# Patient Record
Sex: Female | Born: 1950 | Race: White | Hispanic: No | Smoking: Never smoker
Health system: Southern US, Community
[De-identification: ages and names within clinical notes are randomized; demographics above are authoritative.]

## PROBLEM LIST (undated history)

## (undated) DIAGNOSIS — E785 Hyperlipidemia, unspecified: Secondary | ICD-10-CM

## (undated) DIAGNOSIS — J45909 Unspecified asthma, uncomplicated: Secondary | ICD-10-CM

## (undated) DIAGNOSIS — J302 Other seasonal allergic rhinitis: Secondary | ICD-10-CM

## (undated) DIAGNOSIS — I10 Essential (primary) hypertension: Secondary | ICD-10-CM

## (undated) DIAGNOSIS — N289 Disorder of kidney and ureter, unspecified: Secondary | ICD-10-CM

## (undated) HISTORY — PX: TONSILLECTOMY: SUR1361

## (undated) HISTORY — DX: Hyperlipidemia, unspecified: E78.5

## (undated) HISTORY — DX: Essential (primary) hypertension: I10

## (undated) HISTORY — PX: ADENOIDECTOMY: SUR15

## (undated) HISTORY — DX: Unspecified asthma, uncomplicated: J45.909

## (undated) HISTORY — DX: Disorder of kidney and ureter, unspecified: N28.9

## (undated) HISTORY — DX: Other seasonal allergic rhinitis: J30.2

---

## 1990-09-08 HISTORY — PX: VESICOVAGINAL FISTULA CLOSURE W/ TAH: SUR271

## 1990-09-08 HISTORY — PX: APPENDECTOMY: SHX54

## 1997-12-20 ENCOUNTER — Other Ambulatory Visit: Admission: RE | Admit: 1997-12-20 | Discharge: 1997-12-20 | Payer: Self-pay | Admitting: Obstetrics and Gynecology

## 1998-12-21 ENCOUNTER — Other Ambulatory Visit: Admission: RE | Admit: 1998-12-21 | Discharge: 1998-12-21 | Payer: Self-pay | Admitting: Obstetrics and Gynecology

## 2001-01-13 ENCOUNTER — Other Ambulatory Visit: Admission: RE | Admit: 2001-01-13 | Discharge: 2001-01-13 | Payer: Self-pay | Admitting: Obstetrics and Gynecology

## 2002-01-27 ENCOUNTER — Other Ambulatory Visit: Admission: RE | Admit: 2002-01-27 | Discharge: 2002-01-27 | Payer: Self-pay | Admitting: Obstetrics and Gynecology

## 2003-03-01 ENCOUNTER — Other Ambulatory Visit: Admission: RE | Admit: 2003-03-01 | Discharge: 2003-03-01 | Payer: Self-pay | Admitting: Obstetrics and Gynecology

## 2005-04-07 ENCOUNTER — Other Ambulatory Visit: Admission: RE | Admit: 2005-04-07 | Discharge: 2005-04-07 | Payer: Self-pay | Admitting: Obstetrics and Gynecology

## 2006-04-23 ENCOUNTER — Encounter: Admission: RE | Admit: 2006-04-23 | Discharge: 2006-04-23 | Payer: Self-pay | Admitting: Obstetrics and Gynecology

## 2007-05-05 ENCOUNTER — Encounter: Admission: RE | Admit: 2007-05-05 | Discharge: 2007-05-05 | Payer: Self-pay | Admitting: Obstetrics and Gynecology

## 2008-05-09 ENCOUNTER — Encounter: Admission: RE | Admit: 2008-05-09 | Discharge: 2008-05-09 | Payer: Self-pay | Admitting: Obstetrics and Gynecology

## 2009-05-16 ENCOUNTER — Encounter: Admission: RE | Admit: 2009-05-16 | Discharge: 2009-05-16 | Payer: Self-pay | Admitting: Obstetrics and Gynecology

## 2010-05-21 ENCOUNTER — Encounter: Admission: RE | Admit: 2010-05-21 | Discharge: 2010-05-21 | Payer: Self-pay | Admitting: Obstetrics and Gynecology

## 2010-08-27 ENCOUNTER — Encounter
Admission: RE | Admit: 2010-08-27 | Discharge: 2010-08-27 | Payer: Self-pay | Source: Home / Self Care | Attending: Internal Medicine | Admitting: Internal Medicine

## 2010-09-29 ENCOUNTER — Encounter: Payer: Self-pay | Admitting: Obstetrics and Gynecology

## 2011-02-19 ENCOUNTER — Other Ambulatory Visit: Payer: Self-pay | Admitting: Obstetrics and Gynecology

## 2011-02-19 DIAGNOSIS — Z1231 Encounter for screening mammogram for malignant neoplasm of breast: Secondary | ICD-10-CM

## 2011-05-27 ENCOUNTER — Ambulatory Visit
Admission: RE | Admit: 2011-05-27 | Discharge: 2011-05-27 | Disposition: A | Discharge status: Home / Self Care | Payer: Commercial Managed Care - PPO | Source: Ambulatory Visit | Attending: Obstetrics and Gynecology | Admitting: Obstetrics and Gynecology

## 2011-05-27 DIAGNOSIS — Z1231 Encounter for screening mammogram for malignant neoplasm of breast: Secondary | ICD-10-CM

## 2011-11-04 ENCOUNTER — Other Ambulatory Visit (HOSPITAL_COMMUNITY): Payer: Self-pay | Admitting: Internal Medicine

## 2011-11-04 DIAGNOSIS — E039 Hypothyroidism, unspecified: Secondary | ICD-10-CM

## 2011-11-07 ENCOUNTER — Ambulatory Visit (HOSPITAL_COMMUNITY)
Admission: RE | Admit: 2011-11-07 | Discharge: 2011-11-07 | Disposition: A | Discharge status: Home / Self Care | Payer: 59 | Source: Ambulatory Visit | Attending: Internal Medicine | Admitting: Internal Medicine

## 2011-11-07 DIAGNOSIS — E039 Hypothyroidism, unspecified: Secondary | ICD-10-CM | POA: Insufficient documentation

## 2011-11-07 DIAGNOSIS — R599 Enlarged lymph nodes, unspecified: Secondary | ICD-10-CM | POA: Insufficient documentation

## 2011-12-16 ENCOUNTER — Ambulatory Visit (INDEPENDENT_AMBULATORY_CARE_PROVIDER_SITE_OTHER): Payer: 59 | Admitting: Internal Medicine

## 2011-12-16 ENCOUNTER — Encounter: Payer: Self-pay | Admitting: Internal Medicine

## 2011-12-16 VITALS — BP 132/90 | HR 84 | Temp 98.5°F | Ht 64.0 in | Wt 154.4 lb

## 2011-12-16 DIAGNOSIS — R05 Cough: Secondary | ICD-10-CM | POA: Insufficient documentation

## 2011-12-16 MED ORDER — BECLOMETHASONE DIPROPIONATE 40 MCG/ACT IN AERS: 2.0000 | INHALATION_SPRAY | Freq: Two times a day (BID) | RESPIRATORY_TRACT | Status: DC

## 2011-12-16 NOTE — Progress Notes (Signed)
  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   12/16/2011 1st pulmonary cc new onset refractory daily cough since abrupt onsetDec 2012  not related to seasonal rhinitis just started the zantac 300 mg late march, abx changed mucus from brown to white sev tbsp max per day, last prednisone rx April 1st.  Cough some better nowmostly day some at hs, not worse talking, occ bladder incontinent on hrt due to hot fashes but no overt HB.  Not sure inhalers are helping.   asthmanex made it worse, no better on dulera  Presently Sleeping ok without nocturnal  or early am exacerbation  of respiratory  c/o's or need for noct saba. Also denies any obvious fluctuation of symptoms with weather or environmental changes or other aggravating or alleviating factors except as outlined above   Review of Systems  Constitutional: Negative for fever, chills and unexpected weight change.  HENT: Positive for congestion and sneezing. Negative for ear pain, nosebleeds, sore throat, rhinorrhea, trouble swallowing, dental problem, voice change, postnasal drip and sinus pressure.   Eyes: Negative for visual disturbance.  Respiratory: Positive for cough. Negative for choking and shortness of breath.   Cardiovascular: Negative for chest pain and leg swelling.  Gastrointestinal: Negative for vomiting, abdominal pain and diarrhea.  Genitourinary: Negative for difficulty urinating.  Musculoskeletal: Negative for arthralgias.  Skin: Negative for rash.  Neurological: Negative for tremors, syncope and headaches.  Hematological: Does not bruise/bleed easily.       Objective:   Physical Exam  Amb animated wf nad 154 12/16/2011  HEENT: nl dentition, turbinates, and orophanx. Nl external ear canals  without cough reflex   NECK :  without JVD/Nodes/TM/ nl carotid upstrokes bilaterally   LUNGS: no acc muscle use, clear to A and P bilaterally without cough on insp or exp maneuvers   CV:  RRR  no s3 or murmur or increase in P2, no edema   ABD:  soft and nontender with nl excursion in the supine position. No bruits or organomegaly, bowel sounds nl  MS:  warm without deformities, calf tenderness, cyanosis or clubbing  SKIN: warm and dry without lesions    NEURO:  alert, approp, no deficits   cxr 11/19/11 HH, no acute abn        Assessment & Plan:

## 2011-12-16 NOTE — Patient Instructions (Addendum)
qvar 40 Take 2 puffs first thing in am and then another 2 puffs about 12 hours later.   Stop dulera    Work on inhaler technique:  relax and gently blow all the way out then take a nice smooth deep breath back in, triggering the inhaler at same time you start breathing in.  Hold for up to 5 seconds if you can.  Rinse and gargle with water when done   If your mouth or throat starts to bother you,   I suggest you time the inhaler to your dental care and after using the inhaler(s) brush teeth and tongue with a baking soda containing toothpaste and when you rinse this out, gargle with it first to see if this helps your mouth and throat.     Please see patient coordinator before you leave today  to schedule sinus CT   Reducing hormone pill to every other day  Delsym 2 tsp every 12 hour as needed  GERD (REFLUX)  is an extremely common cause of respiratory symptoms, many times with no significant heartburn at all.    It can be treated with medication, but also with lifestyle changes including avoidance of late meals, excessive alcohol, smoking cessation, and avoid fatty foods, chocolate, peppermint, colas, red wine, and acidic juices such as orange juice.  NO MINT OR MENTHOL PRODUCTS SO NO COUGH DROPS  USE SUGARLESS CANDY INSTEAD (jolley ranchers or Stover's)  NO OIL BASED VITAMINS - use powdered substitutes.   Please schedule a follow up office visit in 3 weeks, sooner if needed

## 2011-12-17 NOTE — Assessment & Plan Note (Signed)
The most common causes of chronic cough in immunocompetent adults include the following: upper airway cough syndrome (UACS), previously referred to as postnasal drip syndrome (PNDS), which is caused by variety of rhinosinus conditions; (2) asthma; (3) GERD; (4) chronic bronchitis from cigarette smoking or other inhaled environmental irritants; (5) nonasthmatic eosinophilic bronchitis; and (6) bronchiectasis.   These conditions, singly or in combination, have accounted for up to 94% of the causes of chronic cough in prospective studies.   Other conditions have constituted no >6% of the causes in prospective studies These have included bronchogenic carcinoma, chronic interstitial pneumonia, sarcoidosis, left ventricular failure, ACEI-induced cough, and aspiration from a condition associated with pharyngeal dysfunction.    Chronic cough is often simultaneously caused by more than one condition. A single cause has been found from 38 to 82% of the time, multiple causes from 18 to 62%. Multiply caused cough has been the result of three diseases up to 42% of the time.   Of the three most common causes of chronic cough, only one (GERD)  can actually cause the other two (asthma and post nasal drip syndrome)  and perpetuate the cylce of cough inducing airway trauma, inflammation, heightened sensitivity to reflux which is prompted by the cough itself via a cyclical mechanism.    This may partially respond to steroids and look like asthma and post nasal drainage but never erradicated completely unless the cough and the secondary reflux are eliminated, preferably both at the same time.  While not intuitively obvious, many patients with chronic low grade reflux do not cough until there is a secondary insult that disturbs the protective epithelial barrier and exposes sensitive nerve endings.  This can be viral or direct physical injury such as with an endotracheal tube.   The point is that once this occurs, it is  difficult to eliminate using anything but a maximally effective acid suppression regimen at least in the short run, accompanied by an appropriate diet to address non acid GERD.   For now max rx for gerd and cyclical cough and try change dulera to qvar since less likely to make her cough due to upper airway irritation (other option is stop ICS altogether)

## 2011-12-19 ENCOUNTER — Encounter: Payer: Self-pay | Admitting: Internal Medicine

## 2011-12-19 ENCOUNTER — Ambulatory Visit (INDEPENDENT_AMBULATORY_CARE_PROVIDER_SITE_OTHER)
Admission: RE | Admit: 2011-12-19 | Discharge: 2011-12-19 | Disposition: A | Discharge status: Home / Self Care | Payer: 59 | Source: Ambulatory Visit | Attending: Internal Medicine | Admitting: Internal Medicine

## 2011-12-19 DIAGNOSIS — R05 Cough: Secondary | ICD-10-CM

## 2011-12-19 NOTE — Progress Notes (Signed)
Quick Note:  Spoke with pt and notified of results per Dr. Wert. Pt verbalized understanding and denied any questions.  ______ 

## 2012-01-06 ENCOUNTER — Encounter: Payer: Self-pay | Admitting: Internal Medicine

## 2012-01-06 ENCOUNTER — Ambulatory Visit (INDEPENDENT_AMBULATORY_CARE_PROVIDER_SITE_OTHER): Payer: 59 | Admitting: Internal Medicine

## 2012-01-06 VITALS — BP 124/90 | HR 85 | Temp 97.7°F | Ht 64.0 in | Wt 164.4 lb

## 2012-01-06 DIAGNOSIS — R05 Cough: Secondary | ICD-10-CM

## 2012-01-06 NOTE — Assessment & Plan Note (Addendum)
Marked improvement on qvar and off dulera while on max gerd rx and tapering off HRT which can cause reflux. Still not clear what the precise cause of the cough is noting that chronic cough is often simultaneously caused by more than one condition. A single cause has been found from 38 to 82% of the time, multiple causes from 18 to 62%. Multiply caused cough has been the result of three diseases up to 42% of the time.   Most likely this gerd with a component of asthma with upper airway irritability that is minimized with the use of qvar as the choice for ICS  She is doing so well I'm inclined to rec she stay on same rx for now with option to taper off the qvar in a few months if doing great.

## 2012-01-06 NOTE — Progress Notes (Signed)
  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   12/16/2011 1st pulmonary cc new onset refractory daily cough since abrupt onsetDec 2012  not related to seasonal rhinitis just started the zantac 300 mg late march, abx changed mucus from brown to white sev tbsp max per day, last prednisone rx April 1st.  Cough some better now mostly day some at hs, not worse talking, occ bladder incontinent on hrt due to hot fashes but no overt HB.  Not sure inhalers are helping.   asthmanex made it worse, no better on dulera rec  qvar 40 Take 2 puffs first thing in am and then another 2 puffs about 12 hours later.   Stop dulera  Work on inhaler technique:  .    Schedule sinus CT > min sphenoidal thickening Reducing hormone pill to every other day Delsym 2 tsp every 12 hour as needed GERD diet   01/06/2012 f/u ov/Gradyn Shein cc cough much better, only using ventolin twice weekly, less than while on dulera, no sob. No purulent sputum or reflux symptoms.  Presently Sleeping ok without nocturnal  or early am exacerbation  of respiratory  c/o's or need for noct saba. Also denies any obvious fluctuation of symptoms with weather or environmental changes or other aggravating or alleviating factors except as outlined above.  ROS  At present neg for  any significant sore throat, dysphagia, dental problems, itching, sneezing,  nasal congestion or excess/ purulent secretions, ear ache,   fever, chills, sweats, unintended wt loss, pleuritic or exertional cp, hemoptysis, palpitations, orthopnea pnd or leg swelling.  Also denies presyncope, palpitations, heartburn, abdominal pain, anorexia, nausea, vomiting, diarrhea  or change in bowel or urinary habits, change in stools or urine, dysuria,hematuria,   rash, arthralgias, visual complaints, headache, numbness weakness or ataxia or problems with walking or coordination. No noted change in mood/affect or memory.                          Objective:   Physical Exam  Amb animated wf nad  154 12/16/2011  > 01/06/2012  164  HEENT: nl dentition, turbinates, and orophanx. Nl external ear canals without cough reflex   NECK :  without JVD/Nodes/TM/ nl carotid upstrokes bilaterally   LUNGS: no acc muscle use, clear to A and P bilaterally without cough on insp or exp maneuvers   CV:  RRR  no s3 or murmur or increase in P2, no edema   ABD:  soft and nontender with nl excursion in the supine position. No bruits or organomegaly, bowel sounds nl  MS:  warm without deformities, calf tenderness, cyanosis or clubbing     cxr 11/19/11 HH, no acute abn        Assessment & Plan:

## 2012-01-06 NOTE — Patient Instructions (Signed)
No change in recommendations   If you are satisfied with your treatment plan let your doctor know and he/she can either refill your medications or you can return here when your prescription runs out.     If in any way you are not 100% satisfied,  please tell us.  If 100% better, tell your friends!

## 2012-01-20 ENCOUNTER — Other Ambulatory Visit: Payer: Self-pay | Admitting: Obstetrics and Gynecology

## 2012-01-20 DIAGNOSIS — Z1231 Encounter for screening mammogram for malignant neoplasm of breast: Secondary | ICD-10-CM

## 2012-05-28 ENCOUNTER — Ambulatory Visit
Admission: RE | Admit: 2012-05-28 | Discharge: 2012-05-28 | Disposition: A | Discharge status: Home / Self Care | Payer: 59 | Source: Ambulatory Visit | Attending: Obstetrics and Gynecology | Admitting: Obstetrics and Gynecology

## 2012-05-28 DIAGNOSIS — Z1231 Encounter for screening mammogram for malignant neoplasm of breast: Secondary | ICD-10-CM

## 2013-03-04 ENCOUNTER — Other Ambulatory Visit: Payer: Self-pay

## 2013-03-04 DIAGNOSIS — Z1231 Encounter for screening mammogram for malignant neoplasm of breast: Secondary | ICD-10-CM

## 2013-05-31 ENCOUNTER — Ambulatory Visit
Admission: RE | Admit: 2013-05-31 | Discharge: 2013-05-31 | Disposition: A | Discharge status: Home / Self Care | Payer: 59 | Source: Ambulatory Visit

## 2013-05-31 DIAGNOSIS — Z1231 Encounter for screening mammogram for malignant neoplasm of breast: Secondary | ICD-10-CM

## 2014-02-27 ENCOUNTER — Other Ambulatory Visit: Payer: Self-pay | Admitting: *Deleted

## 2014-02-27 ENCOUNTER — Other Ambulatory Visit: Payer: Self-pay | Admitting: Internal Medicine

## 2014-02-27 DIAGNOSIS — M810 Age-related osteoporosis without current pathological fracture: Secondary | ICD-10-CM

## 2014-02-28 ENCOUNTER — Other Ambulatory Visit: Payer: Self-pay

## 2014-02-28 DIAGNOSIS — Z1231 Encounter for screening mammogram for malignant neoplasm of breast: Secondary | ICD-10-CM

## 2014-05-31 ENCOUNTER — Other Ambulatory Visit (HOSPITAL_COMMUNITY): Payer: Self-pay | Admitting: Internal Medicine

## 2014-05-31 DIAGNOSIS — R61 Generalized hyperhidrosis: Secondary | ICD-10-CM

## 2014-06-06 ENCOUNTER — Ambulatory Visit
Admission: RE | Admit: 2014-06-06 | Discharge: 2014-06-06 | Disposition: A | Discharge status: Home / Self Care | Payer: 59 | Source: Ambulatory Visit | Attending: Internal Medicine | Admitting: Internal Medicine

## 2014-06-06 ENCOUNTER — Ambulatory Visit
Admission: RE | Admit: 2014-06-06 | Discharge: 2014-06-06 | Disposition: A | Discharge status: Home / Self Care | Payer: 59 | Source: Ambulatory Visit

## 2014-06-06 ENCOUNTER — Ambulatory Visit (HOSPITAL_COMMUNITY)
Admission: RE | Admit: 2014-06-06 | Discharge: 2014-06-06 | Disposition: A | Discharge status: Home / Self Care | Payer: 59 | Source: Ambulatory Visit | Attending: Internal Medicine | Admitting: Internal Medicine

## 2014-06-06 DIAGNOSIS — K838 Other specified diseases of biliary tract: Secondary | ICD-10-CM | POA: Insufficient documentation

## 2014-06-06 DIAGNOSIS — K7689 Other specified diseases of liver: Secondary | ICD-10-CM | POA: Insufficient documentation

## 2014-06-06 DIAGNOSIS — N289 Disorder of kidney and ureter, unspecified: Secondary | ICD-10-CM | POA: Diagnosis not present

## 2014-06-06 DIAGNOSIS — R944 Abnormal results of kidney function studies: Secondary | ICD-10-CM | POA: Insufficient documentation

## 2014-06-06 DIAGNOSIS — R61 Generalized hyperhidrosis: Secondary | ICD-10-CM | POA: Diagnosis present

## 2014-06-06 DIAGNOSIS — M810 Age-related osteoporosis without current pathological fracture: Secondary | ICD-10-CM

## 2014-06-06 DIAGNOSIS — Z1231 Encounter for screening mammogram for malignant neoplasm of breast: Secondary | ICD-10-CM

## 2014-10-12 ENCOUNTER — Other Ambulatory Visit (HOSPITAL_COMMUNITY): Payer: Self-pay | Admitting: Internal Medicine

## 2014-10-12 DIAGNOSIS — R2242 Localized swelling, mass and lump, left lower limb: Secondary | ICD-10-CM

## 2014-10-31 ENCOUNTER — Ambulatory Visit (HOSPITAL_COMMUNITY): Payer: 59

## 2014-11-14 ENCOUNTER — Ambulatory Visit (HOSPITAL_COMMUNITY)
Admission: RE | Admit: 2014-11-14 | Discharge: 2014-11-14 | Disposition: A | Discharge status: Home / Self Care | Payer: 59 | Source: Ambulatory Visit | Attending: Internal Medicine | Admitting: Internal Medicine

## 2014-11-14 DIAGNOSIS — M7122 Synovial cyst of popliteal space [Baker], left knee: Secondary | ICD-10-CM | POA: Insufficient documentation

## 2014-11-14 DIAGNOSIS — R2242 Localized swelling, mass and lump, left lower limb: Secondary | ICD-10-CM

## 2014-11-14 DIAGNOSIS — M67462 Ganglion, left knee: Secondary | ICD-10-CM | POA: Insufficient documentation

## 2015-05-28 DIAGNOSIS — J309 Allergic rhinitis, unspecified: Secondary | ICD-10-CM | POA: Insufficient documentation

## 2015-05-28 DIAGNOSIS — K219 Gastro-esophageal reflux disease without esophagitis: Secondary | ICD-10-CM

## 2015-05-28 DIAGNOSIS — J45909 Unspecified asthma, uncomplicated: Secondary | ICD-10-CM | POA: Insufficient documentation

## 2015-08-16 DIAGNOSIS — J301 Allergic rhinitis due to pollen: Secondary | ICD-10-CM

## 2015-08-17 DIAGNOSIS — J3081 Allergic rhinitis due to animal (cat) (dog) hair and dander: Secondary | ICD-10-CM | POA: Diagnosis not present

## 2015-10-05 ENCOUNTER — Encounter: Payer: Self-pay | Admitting: Allergy and Immunology

## 2015-10-05 ENCOUNTER — Ambulatory Visit (INDEPENDENT_AMBULATORY_CARE_PROVIDER_SITE_OTHER): Payer: Medicare Other | Admitting: Allergy and Immunology

## 2015-10-05 VITALS — BP 122/90 | HR 88 | Resp 16

## 2015-10-05 DIAGNOSIS — J387 Other diseases of larynx: Secondary | ICD-10-CM

## 2015-10-05 DIAGNOSIS — J309 Allergic rhinitis, unspecified: Secondary | ICD-10-CM

## 2015-10-05 DIAGNOSIS — H1010 Acute atopic conjunctivitis, unspecified eye: Secondary | ICD-10-CM

## 2015-10-05 DIAGNOSIS — J4540 Moderate persistent asthma, uncomplicated: Secondary | ICD-10-CM

## 2015-10-05 DIAGNOSIS — K219 Gastro-esophageal reflux disease without esophagitis: Secondary | ICD-10-CM

## 2015-10-05 MED ORDER — RANITIDINE HCL 300 MG PO CAPS: 300.0000 mg | ORAL_CAPSULE | Freq: Every day | ORAL | Status: DC

## 2015-10-05 MED ORDER — OMEPRAZOLE 40 MG PO CPDR: 40.0000 mg | DELAYED_RELEASE_CAPSULE | Freq: Every day | ORAL | Status: DC

## 2015-10-05 NOTE — Patient Instructions (Addendum)
  1. Continue Qvar 40 2 inhalations 1-2 times per day depending on disease activity  2. Continue omeprazole 40 mg in the morning and ranitidine 300 mg in the evening  3. Continue Qnasl 80 1-2 sprays each nostril one time per day   4. Continue antihistamine and ProAir HFA as needed  5. Consider gallbladder functional scan  6. Continue immunotherapy and EpiPen  7. Return to clinic in 6 months or earlier if problem

## 2015-10-05 NOTE — Progress Notes (Signed)
[facility]  Follow-up Note  Referring Provider: Gordan Payment., MD Primary Provider: Feliciana Rossetti, MD Date of Office Visit: 10/05/2015  Subjective:   Lauren Benitez is a 65 y.o. female who [facility] on 10/05/2015 in re-evaluation of the following:  HPI Comments: Lauren Benitez returns to this clinic in reevaluation of her asthma and allergic rhinitis and LPR. She's done quite well over the course of the past year. She did have one flare of her asthma in September for her to get a systemic steroid. The trigger for this flareup. To be an infectious disease involving her upper airways for which she took an antibiotic. Otherwise, she has done really well and does not use any short acting bronchodilator while consistently using her Qvar 40 2 inhalations usually one time per day. She did receive the flu vaccine this year  Lauren Benitez is been doing quite well regarding her upper airway while consistently using her Qnasl. She occasionally takes an antihistamine as well.  Reflux is been going quite well for [facility] as long she continues to use a combination of omeprazole and ranitidine. She has been having this unusual quick onset sharp abdominal pain the last approximately one hour is occurred 5 times over the course of the past 18 months. Apparently she has had a sonogram which identified gallbladder sludge and possibly an abdominal CT scan which did not identify any significant abnormality. She does not get any associated nausea or diarrhea and there is no associated systemic or constitutional symptoms.   Current Outpatient Prescriptions on File Prior to Visit  Medication Sig Dispense Refill  . albuterol (PROAIR HFA) 108 (90 BASE) MCG/ACT inhaler Inhale 2 puffs into the lungs every 6 (six) hours as needed.    . Beclomethasone Dipropionate (QNASL) 80 MCG/ACT AERS Place 1 spray into the nose daily.    . Calcium  Carbonate-Vitamin D (CALTRATE 600+D PO) Take 1 tablet by mouth at bedtime.    Marland Kitchen dextromethorphan-guaiFENesin (MUCINEX DM) 30-600 MG per 12 hr tablet Take 1 tablet by mouth as needed.    . docusate sodium (COLACE) 100 MG capsule Take 100 mg by mouth daily.    Marland Kitchen levothyroxine (SYNTHROID, LEVOTHROID) 50 MCG tablet Take 50 mcg by mouth daily.    Marland Kitchen loratadine (CLARITIN) 10 MG tablet Take 10 mg by mouth daily.    . Multiple Vitamin (MULTIVITAMIN) capsule Take 1 capsule by mouth daily.    . polyethylene glycol (MIRALAX / GLYCOLAX) packet Take 17 g by mouth daily as needed.    . simvastatin (ZOCOR) 40 MG tablet Take 40 mg by mouth every evening.    . beclomethasone (QVAR) 40 MCG/ACT inhaler Inhale 2 puffs into the lungs 2 (two) times daily. 1 Inhaler 12  . citalopram (CELEXA) 20 MG tablet Take 20 mg by mouth daily. Reported on 10/05/2015    . esomeprazole (NEXIUM) 40 MG capsule Take 40 mg by mouth daily after breakfast. Reported on 10/05/2015    . estrogen-methylTESTOSTERone (ESTRATEST HS) 0.625-1.25 MG per tablet Take 1 tablet by mouth daily. Reported on 10/05/2015    . fluconazole (DIFLUCAN) 150 MG tablet Take 150 mg by mouth daily. Reported on 10/05/2015    . mometasone (NASONEX) 50 MCG/ACT nasal spray Place 2 sprays into the nose daily as needed. Reported on 10/05/2015     No current facility-administered medications on file prior to visit.    Meds ordered this encounter  Medications  .  omeprazole (PRILOSEC) 40 MG capsule    Sig: Take 1 capsule (40 mg total) by mouth daily.    Dispense:  90 capsule    Refill:  1    90-day pharmacy  . ranitidine (ZANTAC) 300 MG capsule    Sig: Take 1 capsule (300 mg total) by mouth at bedtime.    Dispense:  90 capsule    Refill:  1    90-day supply    Past Medical History  Diagnosis Date  . Hyperlipemia   . Seasonal allergies   . Asthma     Past Surgical History  Procedure Laterality Date  . Vesicovaginal fistula closure w/ tah  1992  . Appendectomy   1992  . Adenoidectomy    . Tonsillectomy      Allergies  Allergen Reactions  . Avelox [Moxifloxacin Hcl In Nacl] Itching    Review of systems negative except as noted in HPI / PMHx or noted below:  Review of Systems  Constitutional: Negative.   HENT: Negative.   Eyes: Negative.   Respiratory: Negative.   Cardiovascular: Negative.   Gastrointestinal: Negative.   Genitourinary: Negative.   Musculoskeletal: Negative.   Skin: Negative.   Neurological: Negative.   Endo/Heme/Allergies: Negative.   Psychiatric/Behavioral: Negative.      Objective:   Filed Vitals:   10/05/15 1059  BP: 122/90  Pulse: 88  Resp: 16          Physical Exam  Constitutional: She is well-developed, well-nourished, and in no distress.  HENT:  Head: Normocephalic.  Right Ear: Tympanic membrane, external ear and ear canal normal.  Left Ear: Tympanic membrane, external ear and ear canal normal.  Nose: Nose normal. No mucosal edema or rhinorrhea.  Mouth/Throat: Uvula is midline, oropharynx is clear and moist and mucous membranes are normal. No oropharyngeal exudate.  Eyes: Conjunctivae are normal.  Neck: Trachea normal. No tracheal tenderness present. No tracheal deviation present. No thyromegaly present.  Cardiovascular: Normal rate, regular rhythm, S1 normal, S2 normal and normal heart sounds.   No murmur heard. Pulmonary/Chest: Breath sounds normal. No stridor. No respiratory distress. She has no wheezes. She has no rales.  Musculoskeletal: She exhibits no edema.  Lymphadenopathy:       Head (right side): No tonsillar adenopathy present.       Head (left side): No tonsillar adenopathy present.    She has no cervical adenopathy.    She has no axillary adenopathy.  Neurological: She is alert. Gait normal.  Skin: No rash noted. She is not diaphoretic. No erythema. Nails show no clubbing.  Psychiatric: Mood and affect normal.    Diagnostics:    Spirometry was performed and demonstrated an  FEV1 of 2.58 at 108 % of predicted.  The patient had an Asthma Control Test with the following results: ACT Total Score: 23.    Assessment and Plan:   1. Moderate persistent asthma, uncomplicated   2. Allergic rhinoconjunctivitis   3. LPRD (laryngopharyngeal reflux disease)     1. Continue Qvar 40 2 inhalations 1-2 times per day depending on disease activity  2. Continue omeprazole 40 mg in the morning and ranitidine 300 mg in the evening  3. Continue Qnasl 80 1-2 sprays each nostril one time per day   4. Continue antihistamine and ProAir HFA as needed  5. Consider gallbladder functional scan  6. Continue immunotherapy and EpiPen  7. Return to clinic in 6 months or earlier if problem  Lauren Benitez is doing quite well at this point  in time and we will continue to have her use the therapy mentioned above and see her back in this clinic in 6 months or earlier if there is a problem. Certainly she will contact me should she have significant problems in the future. Concerning her sharp pain located in her abdomen that appears to occur on an intermittent basis and last approximately one hour, I suspect that this is some type of obstruction within her GI tract or possibly an ischemic event. She no longer lives in town and [place] asked her to follow-up with her physician regarding further evaluation of this issue which I would think initially would involve a gallbladder functional scan.   [name], MD [facility]

## 2016-01-24 DIAGNOSIS — J301 Allergic rhinitis due to pollen: Secondary | ICD-10-CM | POA: Diagnosis not present

## 2016-01-25 DIAGNOSIS — J3081 Allergic rhinitis due to animal (cat) (dog) hair and dander: Secondary | ICD-10-CM | POA: Diagnosis not present

## 2016-02-08 ENCOUNTER — Ambulatory Visit (INDEPENDENT_AMBULATORY_CARE_PROVIDER_SITE_OTHER): Payer: Medicare Other

## 2016-02-08 DIAGNOSIS — H1010 Acute atopic conjunctivitis, unspecified eye: Secondary | ICD-10-CM

## 2016-02-08 DIAGNOSIS — J309 Allergic rhinitis, unspecified: Secondary | ICD-10-CM

## 2016-02-08 DIAGNOSIS — J3089 Other allergic rhinitis: Secondary | ICD-10-CM

## 2016-02-08 DIAGNOSIS — J302 Other seasonal allergic rhinitis: Secondary | ICD-10-CM

## 2016-02-08 NOTE — Progress Notes (Signed)
Immunotherapy   Patient Details  Name: Lauren Benitez MRN: [id number] Date of Birth: 09/28/1950  02/08/2016  [name]  Pt picked up vials 1-Dust mite 1-cat Red 1;100. Inj given at home by Fransisco HertzJohn Gilbert, Nurse Anesthesist Following schedule: C  Frequency: Build up every 2 weeks, then at .50 continue every 3 weeks Epi-Pen:Epi-Pen Available  Consent signed and patient instructions given.   [name] 02/08/2016, 11:49 AM

## 2016-02-14 ENCOUNTER — Ambulatory Visit: Payer: Medicare Other

## 2016-04-03 ENCOUNTER — Encounter: Payer: Self-pay | Admitting: Allergy and Immunology

## 2016-04-03 ENCOUNTER — Ambulatory Visit (INDEPENDENT_AMBULATORY_CARE_PROVIDER_SITE_OTHER): Payer: Medicare Other | Admitting: Allergy and Immunology

## 2016-04-03 VITALS — BP 110/82 | HR 84 | Resp 12

## 2016-04-03 DIAGNOSIS — K219 Gastro-esophageal reflux disease without esophagitis: Secondary | ICD-10-CM

## 2016-04-03 DIAGNOSIS — J387 Other diseases of larynx: Secondary | ICD-10-CM

## 2016-04-03 DIAGNOSIS — J309 Allergic rhinitis, unspecified: Secondary | ICD-10-CM

## 2016-04-03 DIAGNOSIS — J4530 Mild persistent asthma, uncomplicated: Secondary | ICD-10-CM | POA: Diagnosis not present

## 2016-04-03 DIAGNOSIS — H1010 Acute atopic conjunctivitis, unspecified eye: Secondary | ICD-10-CM | POA: Diagnosis not present

## 2016-04-03 NOTE — Patient Instructions (Signed)
  1. Continue Qvar 40 2 inhalations 1-2 times per day depending on disease activity  2. Continue omeprazole 40 mg in the morning and ranitidine 300 mg in the evening  3. Continue Qnasl 80 1-2 sprays each nostril one time per day   4. Continue antihistamine and ProAir HFA as needed  5. Continue immunotherapy and EpiPen  6. Return to clinic in 6 months or earlier if problem  7. Obtain fall flu vaccine

## 2016-04-03 NOTE — Progress Notes (Signed)
Follow-up Note  Referring Provider: Gordan Payment., MD Primary Provider: Feliciana Rossetti, MD Date of Office Visit: 04/03/2016  Subjective:   Lauren Benitez (DOB: 08-23-1951) is a 65 y.o. female [facility] in re-evaluation of the following:  HPI: Lauren Benitez returns to this clinic in reevaluation of her asthma and allergic rhinitis and LPR. I have not seen her in his clinic in 6 months.  During the interval she has done quite well regarding her asthma. She has not had any exacerbations requiring a systemic steroid and she can exercise without any difficulty and she rarely uses a short acting bronchodilator while using Qvar 40 mostly one time per day.  She has not had any problems with her reflux while using omeprazole and ranitidine in combination. If she misses one dose she develops problems with regurgitation and burning in her chest.  Her nose is doing quite well on her current dose of Qnasl.  She continues on immunotherapy and has not had any adverse effects secondary to this administration.    Medication List      beclomethasone 40 MCG/ACT inhaler Commonly known as:  QVAR Inhale 2 puffs into the lungs 2 (two) times daily.   CALTRATE 600+D PO Take 1 tablet by mouth at bedtime.   docusate sodium 100 MG capsule Commonly known as:  COLACE Take 100 mg by mouth daily.   levothyroxine 50 MCG tablet Commonly known as:  SYNTHROID, LEVOTHROID Take 50 mcg by mouth daily.   loratadine 10 MG tablet Commonly known as:  CLARITIN Take 10 mg by mouth daily.   multivitamin capsule Take 1 capsule by mouth daily.   omeprazole 40 MG capsule Commonly known as:  PRILOSEC Take 1 capsule (40 mg total) by mouth daily.   polyethylene glycol packet Commonly known as:  MIRALAX / GLYCOLAX Take 17 g by mouth daily as needed.   PROAIR HFA 108 (90 Base) MCG/ACT inhaler Generic drug:  albuterol Inhale 2 puffs into the lungs every 6 (six) hours  as needed.   QNASL 80 MCG/ACT Aers Generic drug:  Beclomethasone Dipropionate Place 1 spray into the nose daily.   ranitidine 300 MG capsule Commonly known as:  ZANTAC Take 1 capsule (300 mg total) by mouth at bedtime.   simvastatin 40 MG tablet Commonly known as:  ZOCOR Take 40 mg by mouth every evening.       Past Medical History:  Diagnosis Date  . Asthma   . Hyperlipemia   . Seasonal allergies     Past Surgical History:  Procedure Laterality Date  . ADENOIDECTOMY    . APPENDECTOMY  1992  . TONSILLECTOMY    . VESICOVAGINAL FISTULA CLOSURE W/ TAH  1992    Allergies  Allergen Reactions  . Avelox [Moxifloxacin Hcl In Nacl] Itching    Review of systems negative except as noted in HPI / PMHx or noted below:  Review of Systems  Constitutional: Negative.   HENT: Negative.   Eyes: Negative.   Respiratory: Negative.   Cardiovascular: Negative.   Gastrointestinal: Negative.   Genitourinary: Negative.   Musculoskeletal: Negative.   Skin: Negative.   Neurological: Negative.   Endo/Heme/Allergies: Negative.   Psychiatric/Behavioral: Negative.      Objective:   Vitals:   04/03/16 1455  BP: 110/82  Pulse: 84  Resp: 12          Physical Exam  Constitutional: She is well-developed, well-nourished, and in no distress.  HENT:  Head: Normocephalic.  Right Ear: Tympanic membrane, external ear and ear canal normal.  Left Ear: Tympanic membrane, external ear and ear canal normal.  Nose: Nose normal. No mucosal edema or rhinorrhea.  Mouth/Throat: Uvula is midline, oropharynx is clear and moist and mucous membranes are normal. No oropharyngeal exudate.  Eyes: Conjunctivae are normal.  Neck: Trachea normal. No tracheal tenderness present. No tracheal deviation present. No thyromegaly present.  Cardiovascular: Normal rate, regular rhythm, S1 normal, S2 normal and normal heart sounds.   No murmur heard. Pulmonary/Chest: Breath sounds normal. No stridor. No  respiratory distress. She has no wheezes. She has no rales.  Musculoskeletal: She exhibits no edema.  Lymphadenopathy:       Head (right side): No tonsillar adenopathy present.       Head (left side): No tonsillar adenopathy present.    She has no cervical adenopathy.  Neurological: She is alert. Gait normal.  Skin: No rash noted. She is not diaphoretic. No erythema. Nails show no clubbing.  Psychiatric: Mood and affect normal.    Diagnostics:    Spirometry was performed and demonstrated an FEV1 of 2.23 at 93 % of predicted.  The patient had an Asthma Control Test with the following results: ACT Total Score: 23.    Assessment and Plan:   1. Asthma, well controlled, mild persistent   2. Allergic rhinoconjunctivitis   3. LPRD (laryngopharyngeal reflux disease)     1. Continue Qvar 40 2 inhalations 1-2 times per day depending on disease activity  2. Continue omeprazole 40 mg in the morning and ranitidine 300 mg in the evening  3. Continue Qnasl 80 1-2 sprays each nostril one time per day   4. Continue antihistamine and ProAir HFA as needed  5. Continue immunotherapy and EpiPen  6. Return to clinic in 6 months or earlier if problem  7. Obtain fall flu vaccine  Lauren Benitez has really done quite well on her current medical therapy which includes immunotherapy as well as low doses of Qvar and Qnasl. In addition her reflux appears to be under very good control while using a combination of her proton pump inhibitor and H2 receptor blocker. We'll continue to have her use this plan and I'll see her back in this clinic in approximately 6 months or earlier if there is a problem. I've encouraged her to obtain a flu vaccine this fall.  Lauren Schimke, MD [facility]

## 2016-04-04 ENCOUNTER — Ambulatory Visit: Payer: Medicare Other | Admitting: Allergy and Immunology

## 2016-05-29 ENCOUNTER — Other Ambulatory Visit: Payer: Self-pay | Admitting: Allergy and Immunology

## 2016-06-02 IMAGING — MR MR KNEE*L* W/O CM
4 of 5 series · 19 of 40 positions shown · non-contrast
Comparison: None.

CLINICAL DATA: Baker's cyst. Palpable mass posterior RIGHT knee for
4 months.

EXAM:
MRI OF THE LEFT KNEE WITHOUT CONTRAST
TECHNIQUE: Multiplanar, multisequence MR imaging of the knee was performed. No
intravenous contrast was administered.

[Series 4: PD fat-sat · axial · 3.0mm · 0.31mm/px · z∈[-101,+28]mm · 9 of 38 slices shown (1 of 3)]
[im 1/38]
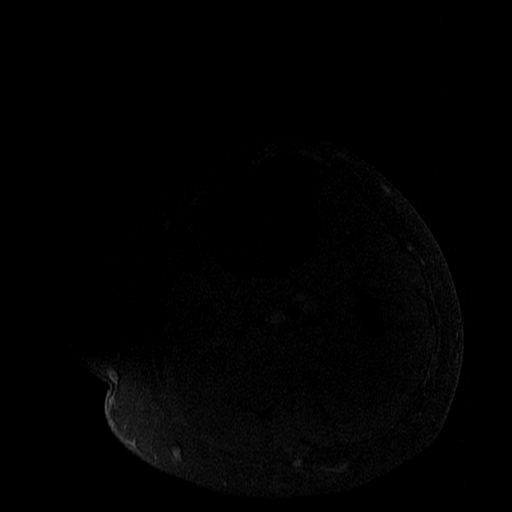
[im 5/38]
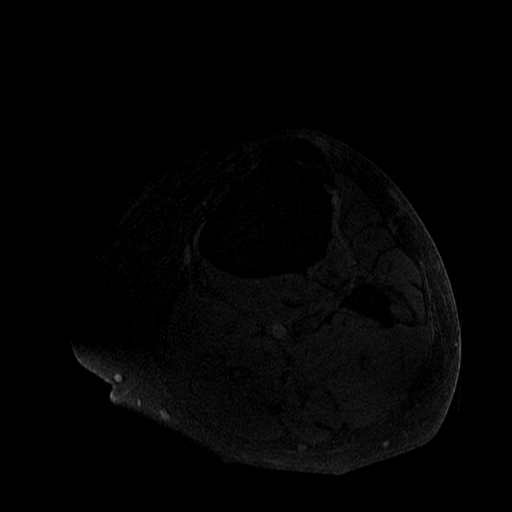
[im 10/38]
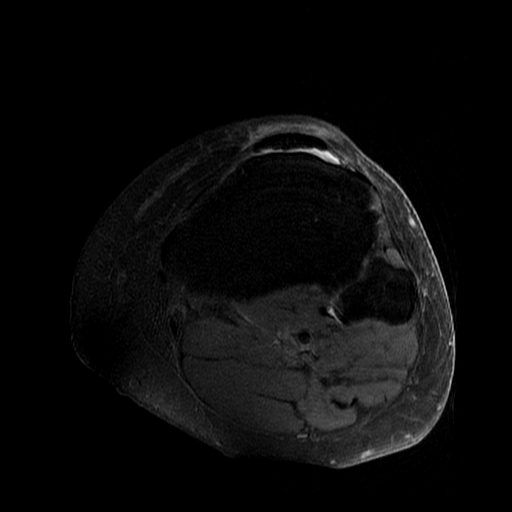
[im 14/38]
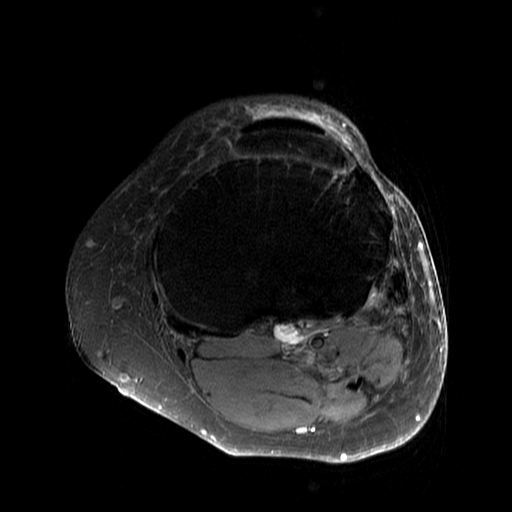
[im 19/38]
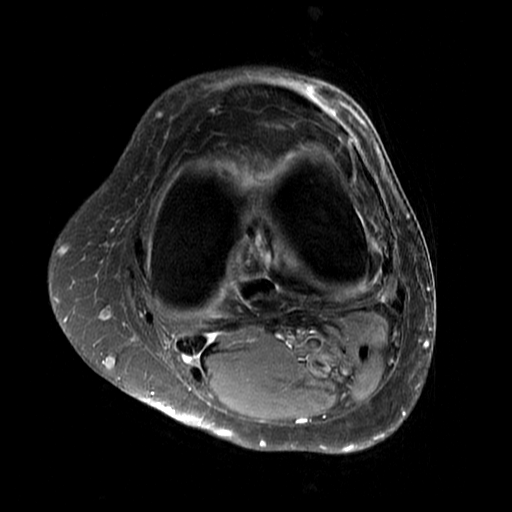
[im 24/38]
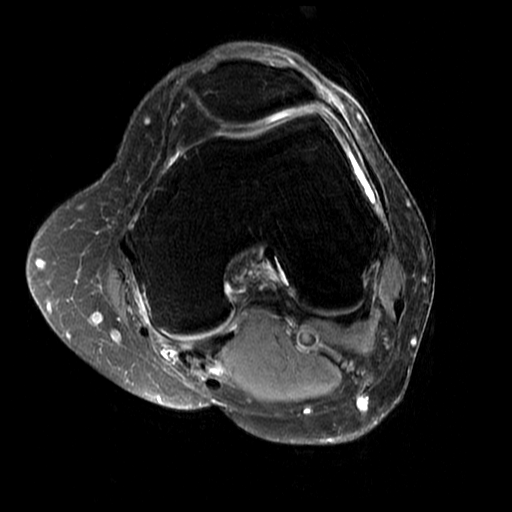
[im 28/38]
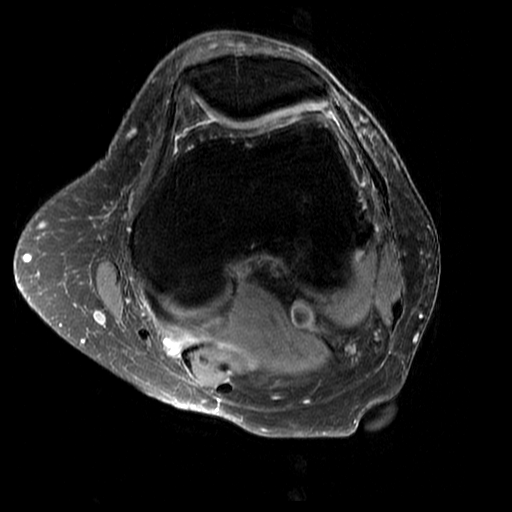
[im 33/38]
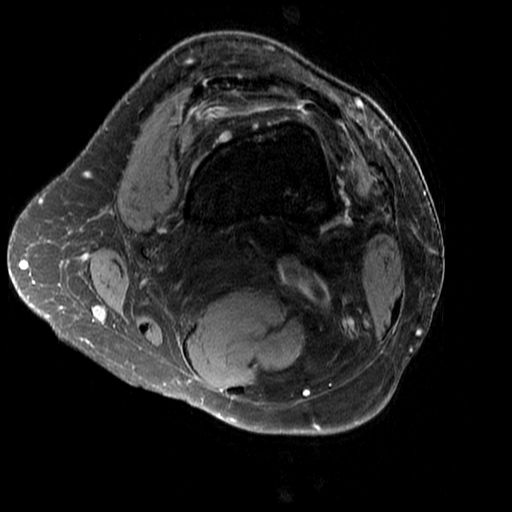
[im 38/38]
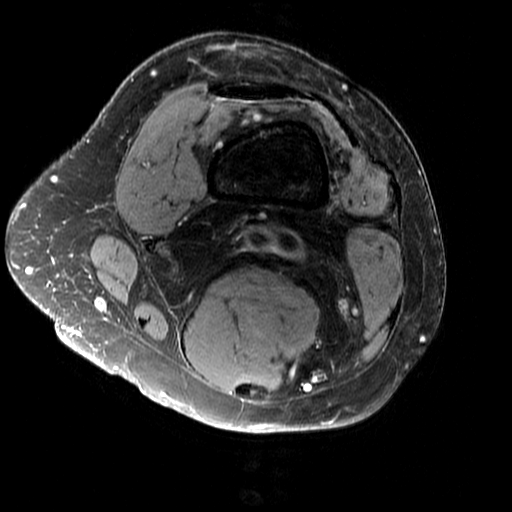

[Series 7: PD fat-sat · coronal · 3.0mm · 0.31mm/px · 4 of 39 slices shown (2 of 3)]
[im 1/39]
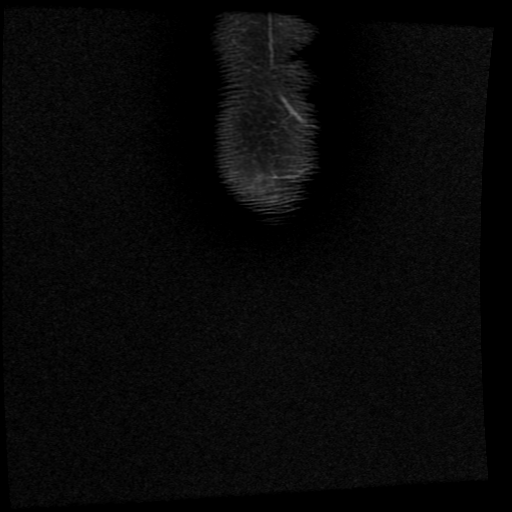
[im 6/39]
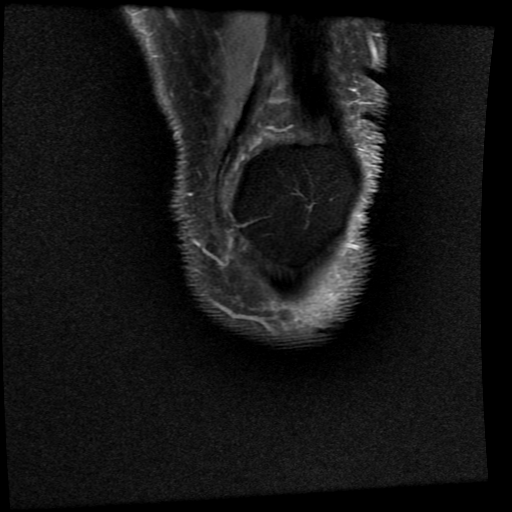
[im 22/39]
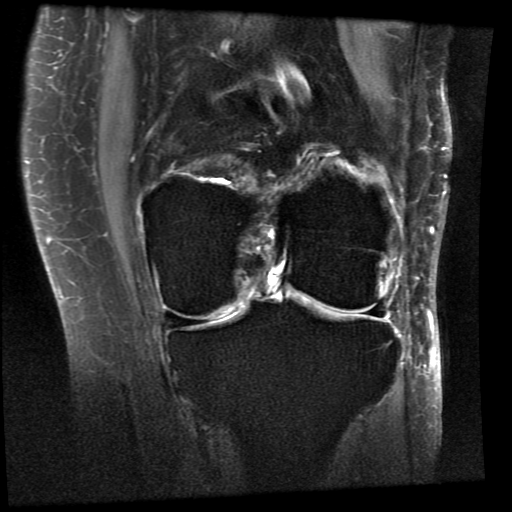
[im 33/39]
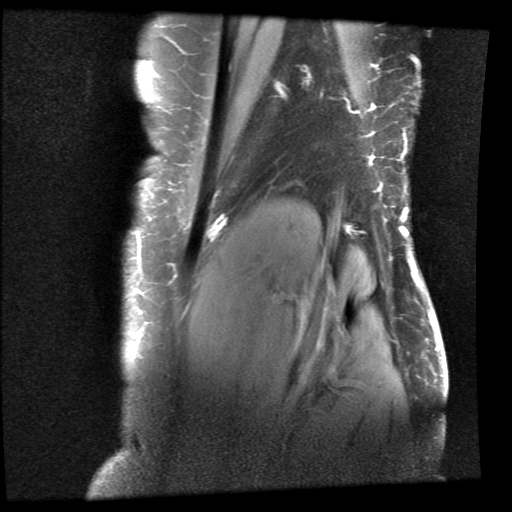

[Series 8: PD fat-sat · sagittal · 3.0mm · 0.29mm/px · 3 of 32 slices shown (3 of 3)]
[im 6/32]
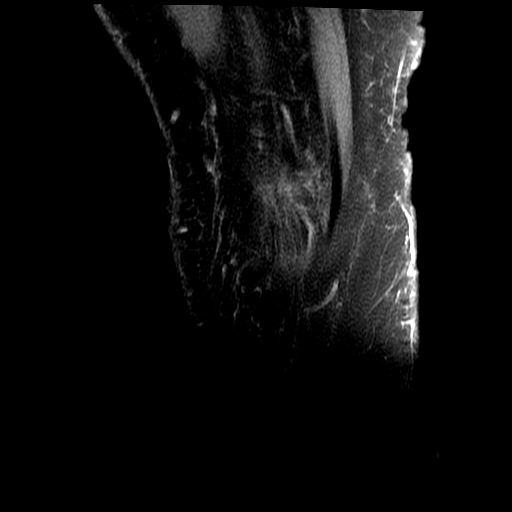
[im 16/32]
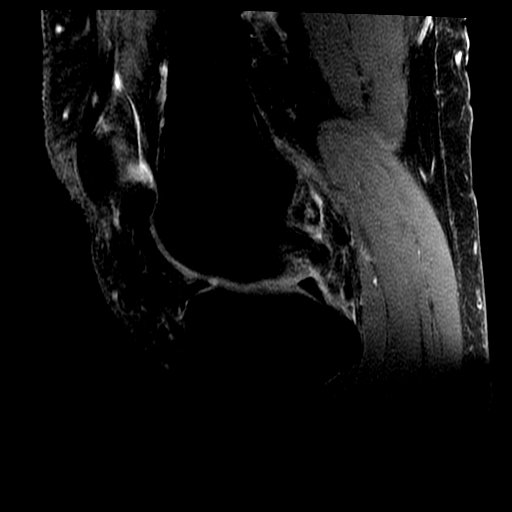
[im 26/32]
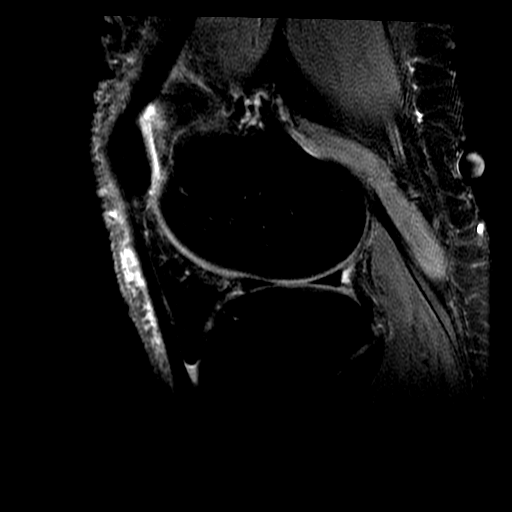

[Series 9: T2 fat-sat · coronal · 3.0mm · 0.31mm/px · 3 of 39 slices shown]
[im 6/39]
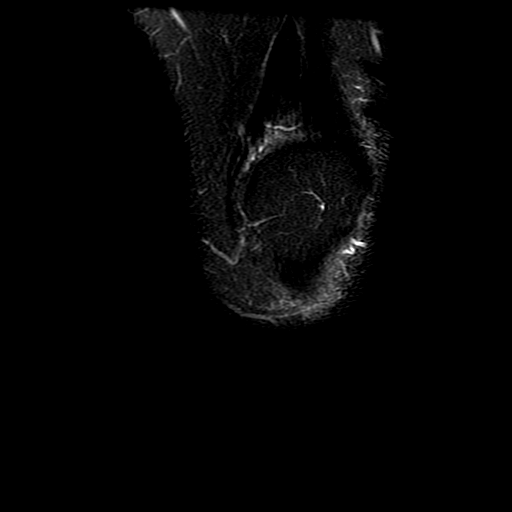
[im 22/39]
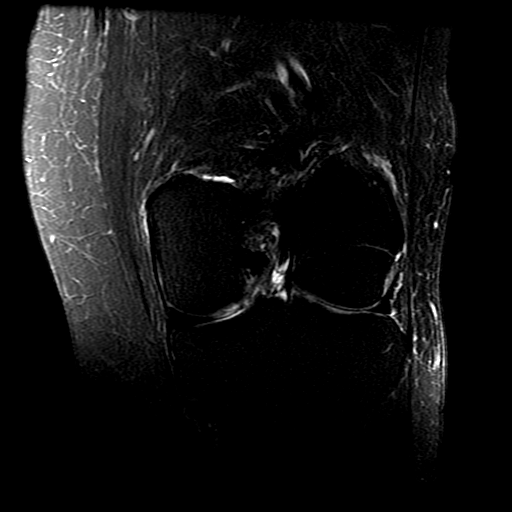
[im 33/39]
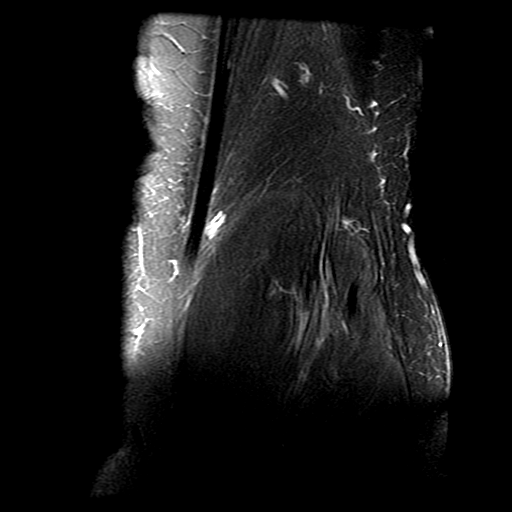

[19 of 40 positions shown; findings below may reference images not displayed]

FINDINGS: There is no soft tissue mass identified. Area palpable abnormality
was marked and only prominent subcutaneous fat is present in this
region. This is in the posterior lateral knee.

MENISCI

Medial meniscus:  Intact.

Lateral meniscus:  Intact.

LIGAMENTS

Cruciates: Intact. Small intrasubstance ganglion in the ACL. This
exits the ACL at the femoral attachment. PCL appears normal.

Collaterals:  Normal.

CARTILAGE

Patellofemoral:  Normal.

Medial:  Normal.

Lateral:  Normal.

Joint:  No effusion.

Popliteal Fossa: Small uncomplicated Baker's cyst. Small ganglion
cyst is present deep in the popliteal fossa along the superficial
aspect of the popliteus.

Extensor Mechanism:  Intact.

Bones:  Normal.
IMPRESSION: 1. Negative for mass in the popliteal fossa.
2. Small uncomplicated Baker's cyst.
3. Intra and extra substance small ACL ganglion cyst.

## 2016-07-10 ENCOUNTER — Other Ambulatory Visit: Payer: Self-pay

## 2016-07-10 ENCOUNTER — Telehealth: Payer: Self-pay

## 2016-07-10 MED ORDER — FLUCONAZOLE 150 MG PO TABS: ORAL_TABLET | ORAL | 0 refills | Status: DC

## 2016-07-10 NOTE — Telephone Encounter (Signed)
Pt advised, RX sent.

## 2016-07-10 NOTE — Telephone Encounter (Signed)
Has increased Symbicort due to respiratory flare. Now has oral thrush. Requesting Diflucan.

## 2016-07-10 NOTE — Telephone Encounter (Signed)
Please provide Diflucan 150 mg tablet today and can repeat in 1 week if needed

## 2016-07-28 DIAGNOSIS — J3081 Allergic rhinitis due to animal (cat) (dog) hair and dander: Secondary | ICD-10-CM | POA: Diagnosis not present

## 2016-07-29 DIAGNOSIS — J301 Allergic rhinitis due to pollen: Secondary | ICD-10-CM | POA: Diagnosis not present

## 2016-08-08 ENCOUNTER — Ambulatory Visit (INDEPENDENT_AMBULATORY_CARE_PROVIDER_SITE_OTHER): Payer: Medicare Other | Admitting: *Deleted

## 2016-08-08 DIAGNOSIS — J309 Allergic rhinitis, unspecified: Secondary | ICD-10-CM

## 2016-08-08 NOTE — Progress Notes (Signed)
Picked up vials - take out of clinic and son-in-law gives injs. 0.1 cc given today. Red 1:100 Cat and Grass/Tree. Build up every 2 weeks and every 3 weeks at 0.50. All information given.

## 2016-10-09 ENCOUNTER — Ambulatory Visit (INDEPENDENT_AMBULATORY_CARE_PROVIDER_SITE_OTHER): Payer: Medicare Other | Admitting: Allergy and Immunology

## 2016-10-09 ENCOUNTER — Encounter: Payer: Self-pay | Admitting: Allergy and Immunology

## 2016-10-09 VITALS — BP 144/82 | HR 88 | Resp 20

## 2016-10-09 DIAGNOSIS — J3089 Other allergic rhinitis: Secondary | ICD-10-CM

## 2016-10-09 DIAGNOSIS — K219 Gastro-esophageal reflux disease without esophagitis: Secondary | ICD-10-CM | POA: Diagnosis not present

## 2016-10-09 DIAGNOSIS — J4530 Mild persistent asthma, uncomplicated: Secondary | ICD-10-CM

## 2016-10-09 NOTE — Progress Notes (Signed)
Follow-up Note  Referring Provider: Gordan PaymentGrisso, Greg A., MD Primary Provider: Feliciana RossettiGRISSO,GREG, MD Date of Office Visit: 10/09/2016  Subjective:   Lauren Benitez (DOB: 04/26/1951) is a 66 y.o. female who [facility]2016 in re-evaluation of the following:  HPI: Lauren AspCindy returns to this clinic in reevaluation of her asthma and allergic rhinitis treated with immunotherapy and LPR. I last saw her in this clinic in July 2017.  She has not required a systemic steroid or an antibiotic to treat the either a upper or lower airway issue. Occasionally she will increase her Qvar when she has more asthma activity but that relatively rare. On a very rare occasion she'll add Symbicort with Qvar. She rarely uses her short-acting bronchodilator and she can exercise without any problem.  Reflux is under very good control as long as she continues to use omeprazole and ranitidine in combination. Her nose has been doing very well using Qnasl.  Immunotherapy has been doing quite well. She's out to every 3 weeks at this point in time. She's not had any adverse effect from using this therapy.   She did obtain a flu vaccine this year and has already had the shingles vaccine. She's not sure about the Pneumovax or Prevnar.    Allergies as of 10/09/2016      Reactions   Avelox [moxifloxacin Hcl In Nacl] Itching      Medication List      beclomethasone 40 MCG/ACT inhaler Commonly known as:  QVAR Inhale 2 puffs into the lungs daily.   beclomethasone 40 MCG/ACT inhaler Commonly known as:  QVAR Inhale 2 puffs into the lungs 2 (two) times daily.   CALTRATE 600+D PO Take 1 tablet by mouth at bedtime.   docusate sodium 100 MG capsule Commonly known as:  COLACE Take 100 mg by mouth daily.   levothyroxine 50 MCG tablet Commonly known as:  SYNTHROID, LEVOTHROID Take 50 mcg by mouth daily.   loratadine 10 MG tablet Commonly known as:  CLARITIN Take 10 mg by mouth  daily.   multivitamin capsule Take 1 capsule by mouth daily.   omeprazole 40 MG capsule Commonly known as:  PRILOSEC TAKE 1 CAPSULE EVERY DAY   OSPHENA PO Take 1 tablet by mouth daily.   PROAIR HFA 108 (90 Base) MCG/ACT inhaler Generic drug:  albuterol Inhale 2 puffs into the lungs every 6 (six) hours as needed.   QNASL 80 MCG/ACT Aers Generic drug:  Beclomethasone Dipropionate Place 1 spray into the nose daily.   ranitidine 300 MG capsule Commonly known as:  ZANTAC Take 1 capsule (300 mg total) by mouth at bedtime.   simvastatin 40 MG tablet Commonly known as:  ZOCOR Take 40 mg by mouth every evening.   SYMBICORT 160-4.5 MCG/ACT inhaler Generic drug:  budesonide-formoterol Inhale 2 puffs into the lungs 2 (two) times daily as needed.       Past Medical History:  Diagnosis Date  . Asthma   . Hyperlipemia   . Seasonal allergies     Past Surgical History:  Procedure Laterality Date  . ADENOIDECTOMY    . APPENDECTOMY  1992  . TONSILLECTOMY    . VESICOVAGINAL FISTULA CLOSURE W/ TAH  1992    Review of systems negative except as noted in HPI / PMHx or noted below:  Review of Systems  Constitutional: Negative.   HENT: Negative.   Eyes: Negative.   Respiratory: Negative.   Cardiovascular: Negative.   Gastrointestinal: Negative.  Genitourinary: Negative.   Musculoskeletal: Negative.   Skin: Negative.   Neurological: Negative.   Endo/Heme/Allergies: Negative.   Psychiatric/Behavioral: Negative.      Objective:   Vitals:   10/09/16 1344  BP: (!) 144/82  Pulse: 88  Resp: 20          Physical Exam  Constitutional: She is well-developed, well-nourished, and in no distress.  HENT:  Head: Normocephalic.  Right Ear: Tympanic membrane, external ear and ear canal normal.  Left Ear: Tympanic membrane, external ear and ear canal normal.  Nose: Nose normal. No mucosal edema or rhinorrhea.  Mouth/Throat: Uvula is midline, oropharynx is clear and moist  and mucous membranes are normal. No oropharyngeal exudate.  Eyes: Conjunctivae are normal.  Neck: Trachea normal. No tracheal tenderness present. No tracheal deviation present. No thyromegaly present.  Cardiovascular: Normal rate, regular rhythm, S1 normal, S2 normal and normal heart sounds.   No murmur heard. Pulmonary/Chest: Breath sounds normal. No stridor. No respiratory distress. She has no wheezes. She has no rales.  Musculoskeletal: She exhibits no edema.  Lymphadenopathy:       Head (right side): No tonsillar adenopathy present.       Head (left side): No tonsillar adenopathy present.    She has no cervical adenopathy.  Neurological: She is alert. Gait normal.  Skin: No rash noted. She is not diaphoretic. No erythema. Nails show no clubbing.  Psychiatric: Mood and affect normal.    Diagnostics:    Spirometry was performed and demonstrated an FEV1 of 2.07 at 87 % of predicted.  The patient had an Asthma Control Test with the following results: ACT Total Score: 22.    Assessment and Plan:   1. Asthma, well controlled, mild persistent   2. Other allergic rhinitis   3. LPRD (laryngopharyngeal reflux disease)     1. Continue Qvar 40 2 inhalations 1-2 times per day depending on disease activity. Increase to 3 inhalations three times per day during 'flare up'  2. Continue omeprazole 40 mg in the morning and ranitidine 300 mg in the evening  3. Continue Qnasl 80 1-2 sprays each nostril one time per day   4. Continue antihistamine and ProAir HFA as needed  5. Continue immunotherapy and EpiPen  6. Return to clinic in 12 months or earlier if problem  7. Pneumovax Prevnar  Lauren Benitez appears to be doing very well on her current plan and we will now see her back in this clinic in 1 year while she continues to use anti-inflammatory agents for her respiratory tract and therapy directed against reflux as well as immunotherapy. She will contact us should she have any significant problems  during the interval. I did give her the name of Pneumovax and Prevnar and asked her to check on her immunization status requiring these agents wiith her doctor.  [name], MD Allergy / Immunology [facility]

## 2016-10-09 NOTE — Patient Instructions (Signed)
  1. Continue Qvar 40 2 inhalations 1-2 times per day depending on disease activity. Increase to 3 inhalations three times per day during 'flare up'  2. Continue omeprazole 40 mg in the morning and ranitidine 300 mg in the evening  3. Continue Qnasl 80 1-2 sprays each nostril one time per day   4. Continue antihistamine and ProAir HFA as needed  5. Continue immunotherapy and EpiPen  6. Return to clinic in 12 months or earlier if problem  7. Pneumovax Prevnar

## 2016-10-30 ENCOUNTER — Other Ambulatory Visit: Payer: Self-pay

## 2016-10-30 MED ORDER — RANITIDINE HCL 300 MG PO CAPS: 300.0000 mg | ORAL_CAPSULE | Freq: Every day | ORAL | 1 refills | Status: DC

## 2016-10-30 MED ORDER — OMEPRAZOLE 40 MG PO CPDR: 40.0000 mg | DELAYED_RELEASE_CAPSULE | Freq: Two times a day (BID) | ORAL | 3 refills | Status: DC

## 2016-11-24 NOTE — Addendum Note (Signed)
Addended by: Virl SonGAINEY, DAMITA D on: 11/24/2016 09:13 AM   Modules accepted: Orders

## 2017-04-14 DIAGNOSIS — J301 Allergic rhinitis due to pollen: Secondary | ICD-10-CM

## 2017-04-15 DIAGNOSIS — J3081 Allergic rhinitis due to animal (cat) (dog) hair and dander: Secondary | ICD-10-CM | POA: Diagnosis not present

## 2017-05-13 DIAGNOSIS — J3089 Other allergic rhinitis: Secondary | ICD-10-CM | POA: Diagnosis not present

## 2017-05-13 NOTE — Progress Notes (Signed)
Vials exp 05-13-18 

## 2017-05-14 DIAGNOSIS — J301 Allergic rhinitis due to pollen: Secondary | ICD-10-CM

## 2017-05-28 ENCOUNTER — Ambulatory Visit (INDEPENDENT_AMBULATORY_CARE_PROVIDER_SITE_OTHER): Payer: Medicare Other | Admitting: *Deleted

## 2017-05-28 DIAGNOSIS — J309 Allergic rhinitis, unspecified: Secondary | ICD-10-CM

## 2017-05-28 NOTE — Progress Notes (Signed)
Picked up vials - take out of clinic and son-in-law gives injs. 0.1 cc given today. Red 1:100 Cat and Grass/Tree. Build up every 2 weeks and every 3 weeks at 0.50. All information given.  

## 2017-09-24 DIAGNOSIS — J301 Allergic rhinitis due to pollen: Secondary | ICD-10-CM

## 2017-09-25 DIAGNOSIS — J3089 Other allergic rhinitis: Secondary | ICD-10-CM

## 2017-09-28 NOTE — Progress Notes (Signed)
VIALS EXP 09-28-18

## 2017-10-19 ENCOUNTER — Encounter: Payer: Self-pay | Admitting: Allergy and Immunology

## 2017-10-19 ENCOUNTER — Ambulatory Visit (INDEPENDENT_AMBULATORY_CARE_PROVIDER_SITE_OTHER): Payer: Medicare Other | Admitting: Allergy and Immunology

## 2017-10-19 VITALS — BP 132/80 | HR 88 | Resp 20

## 2017-10-19 DIAGNOSIS — J3089 Other allergic rhinitis: Secondary | ICD-10-CM

## 2017-10-19 DIAGNOSIS — K219 Gastro-esophageal reflux disease without esophagitis: Secondary | ICD-10-CM | POA: Diagnosis not present

## 2017-10-19 DIAGNOSIS — J4530 Mild persistent asthma, uncomplicated: Secondary | ICD-10-CM

## 2017-10-19 NOTE — Patient Instructions (Addendum)
  1. Continue Qvar 40 2 inhalations 1-2 times per day depending on disease activity. Increase to 3 inhalations three times per day during 'flare up'  2. Continue omeprazole 40 mg in the morning and ranitidine 300 mg in the evening  3. Continue Qnasl 80 1-2 sprays each nostril one time per day   4. Continue antihistamine and ProAir HFA as needed  5. Continue immunotherapy and EpiPen  6. Return to clinic in 12 months or earlier if problem

## 2017-10-19 NOTE — Progress Notes (Signed)
Follow-up Note  Referring Provider: Gordan PaymentGrisso, Greg A., MD Primary Provider: Gordan PaymentGrisso, Greg A., MD Date of Office Visit: 10/19/2017  Subjective:   Lauren Benitez (DOB: 10/29/1950) is a 67 y.o. female who [facility] 10[facility]2017 in re-evaluation of the following:  HPI: Lauren AspCindy returns to this clinic in reevaluation of asthma and allergic rhinitis and LPR.  Her last visit to this clinic was 09 October 2016.  She is utilizing immunotherapy currently at every 3 weeks without any incident.  This has helped her significantly regarding her chronic upper airway and lower airway symptoms.  She has not required systemic steroid or antibiotic to treat any type of respiratory tract issue.  She rarely uses a short acting bronchodilator and she can exert herself without any problem.  She consistently uses Qvar usually at one time per day and occasionally a nasal steroid.  Reflux has been under excellent control while utilizing a combination of a proton pump inhibitor and H2 receptor blocker.  She did obtain the flu vaccine and last year she did receive Pneumovax and Prevnar.  She had a colonoscopy in November 2018 and ever since that point in time she has had chronic diarrhea.  She just had stool collection and analysis performed this past week.  Allergies as of 10/19/2017      Reactions   Avelox [moxifloxacin Hcl In Nacl] Itching      Medication List      beclomethasone 40 MCG/ACT inhaler Commonly known as:  QVAR Inhale 2 puffs into the lungs daily.   beclomethasone 40 MCG/ACT inhaler Commonly known as:  QVAR Inhale 2 puffs into the lungs 2 (two) times daily.   CALTRATE 600+D PO Take 1 tablet by mouth at bedtime.   docusate sodium 100 MG capsule Commonly known as:  COLACE Take 100 mg by mouth daily.   levothyroxine 50 MCG tablet Commonly known as:  SYNTHROID, LEVOTHROID Take 50 mcg by mouth daily.   loratadine 10 MG tablet Commonly known as:   CLARITIN Take 10 mg by mouth daily.   multivitamin capsule Take 1 capsule by mouth daily.   omeprazole 40 MG capsule Commonly known as:  PRILOSEC Take 1 capsule (40 mg total) by mouth 2 (two) times daily.   OSPHENA PO Take 1 tablet by mouth daily.   PROAIR HFA 108 (90 Base) MCG/ACT inhaler Generic drug:  albuterol Inhale 2 puffs into the lungs every 6 (six) hours as needed.   QNASL 80 MCG/ACT Aers Generic drug:  Beclomethasone Dipropionate Place 1 spray into the nose daily.   ranitidine 300 MG capsule Commonly known as:  ZANTAC Take 1 capsule (300 mg total) by mouth at bedtime.   simvastatin 40 MG tablet Commonly known as:  ZOCOR Take 40 mg by mouth every evening.       Past Medical History:  Diagnosis Date  . Asthma   . Hyperlipemia   . Seasonal allergies     Past Surgical History:  Procedure Laterality Date  . ADENOIDECTOMY    . APPENDECTOMY  1992  . TONSILLECTOMY    . VESICOVAGINAL FISTULA CLOSURE W/ TAH  1992    Review of systems negative except as noted in HPI / PMHx or noted below:  Review of Systems  Constitutional: Negative.   HENT: Negative.   Eyes: Negative.   Respiratory: Negative.   Cardiovascular: Negative.   Gastrointestinal: Negative.   Genitourinary: Negative.   Musculoskeletal: Negative.   Skin: Negative.  Neurological: Negative.   Endo/Heme/Allergies: Negative.   Psychiatric/Behavioral: Negative.      Objective:   Vitals:   10/19/17 1610  BP: 132/80  Pulse: 88  Resp: 20          Physical Exam  Constitutional: She is well-developed, well-nourished, and in no distress.  HENT:  Head: Normocephalic.  Right Ear: Tympanic membrane, external ear and ear canal normal.  Left Ear: Tympanic membrane, external ear and ear canal normal.  Nose: Nose normal. No mucosal edema or rhinorrhea.  Mouth/Throat: Uvula is midline, oropharynx is clear and moist and mucous membranes are normal. No oropharyngeal exudate.  Eyes: Conjunctivae  are normal.  Neck: Trachea normal. No tracheal tenderness present. No tracheal deviation present. No thyromegaly present.  Cardiovascular: Normal rate, regular rhythm, S1 normal, S2 normal and normal heart sounds.  No murmur heard. Pulmonary/Chest: Breath sounds normal. No stridor. No respiratory distress. She has no wheezes. She has no rales.  Musculoskeletal: She exhibits no edema.  Lymphadenopathy:       Head (right side): No tonsillar adenopathy present.       Head (left side): No tonsillar adenopathy present.    She has no cervical adenopathy.  Neurological: She is alert. Gait normal.  Skin: No rash noted. She is not diaphoretic. No erythema. Nails show no clubbing.  Psychiatric: Mood and affect normal.    Diagnostics:    Spirometry was performed and demonstrated an FEV1 of 2.17 at 92 % of predicted.  The patient had an Asthma Control Test with the following results: ACT Total Score: 23.    Assessment and Plan:   1. Asthma, well controlled, mild persistent   2. Other allergic rhinitis   3. LPRD (laryngopharyngeal reflux disease)     1. Continue Qvar 40 2 inhalations 1-2 times per day depending on disease activity. Increase to 3 inhalations three times per day during 'flare up'  2. Continue omeprazole 40 mg in the morning and ranitidine 300 mg in the evening  3. Continue Qnasl 80 1-2 sprays each nostril one time per day   4. Continue antihistamine and ProAir HFA as needed  5. Continue immunotherapy and EpiPen  6. Return to clinic in 12 months or earlier if problem  Overall Lauren Benitez is doing quite well on her current plan which includes low dose inhaled and nasal steroids and therapy directed against reflux with a proton pump inhibitor and a H2 receptor blocker.  She will continue on immunotherapy as this has also helped her significantly and I will see her back in his clinic in 12 months or earlier if there is a problem.  [name], MD Allergy / Immunology [facility]

## 2017-10-20 ENCOUNTER — Encounter: Payer: Self-pay | Admitting: Allergy and Immunology

## 2017-11-24 ENCOUNTER — Encounter: Payer: Self-pay | Admitting: Gastroenterology

## 2018-01-06 ENCOUNTER — Other Ambulatory Visit: Payer: Self-pay | Admitting: *Deleted

## 2018-01-06 MED ORDER — ALBUTEROL SULFATE HFA 108 (90 BASE) MCG/ACT IN AERS: 2.0000 | INHALATION_SPRAY | Freq: Four times a day (QID) | RESPIRATORY_TRACT | 2 refills | Status: DC | PRN

## 2018-04-02 ENCOUNTER — Telehealth: Payer: Self-pay | Admitting: *Deleted

## 2018-04-02 NOTE — Telephone Encounter (Signed)
Patient advised that she has been on Omeprazole 40mg  for a long time. She advised she has been diagnosed with stage 3 kidney disease and function of 30%.  Her nephrologist recommended she try zantac twice daily to replace the Omeprazole but did not help her reflux.  She wants to know if you have any other ideas such as PPI that will not affect kidney function.

## 2018-04-05 NOTE — Telephone Encounter (Signed)
Patient advised of instructions. 

## 2018-04-05 NOTE — Telephone Encounter (Signed)
I would recommend 300mg  of ranitidine two times per day, minimize caffeine and chocolate and alcohol, and if this works then no need for PPI

## 2018-05-20 ENCOUNTER — Ambulatory Visit (INDEPENDENT_AMBULATORY_CARE_PROVIDER_SITE_OTHER): Payer: Medicare Other | Admitting: Allergy and Immunology

## 2018-05-20 ENCOUNTER — Encounter: Payer: Self-pay | Admitting: Allergy and Immunology

## 2018-05-20 VITALS — BP 122/78 | HR 88 | Resp 20

## 2018-05-20 DIAGNOSIS — J4530 Mild persistent asthma, uncomplicated: Secondary | ICD-10-CM | POA: Diagnosis not present

## 2018-05-20 DIAGNOSIS — K219 Gastro-esophageal reflux disease without esophagitis: Secondary | ICD-10-CM

## 2018-05-20 DIAGNOSIS — J3089 Other allergic rhinitis: Secondary | ICD-10-CM

## 2018-05-20 NOTE — Patient Instructions (Addendum)
  1. Continue Qvar 40 2 inhalations 1-2 times per day depending on disease activity. Increase to 3 inhalations three times per day during 'flare up'  2. Continue ranitidine 300 mg 2 times per day and use tums if needed  3. Continue nasal steroid (OTC Nasacort) 1-2 sprays each nostril one time per day   4. Continue antihistamine and ProAir HFA as needed  5. Discontinue immunotherapy  6. Return to clinic in 12 months or earlier if problem  7. Obtain fall flu vaccine

## 2018-05-20 NOTE — Progress Notes (Signed)
Follow-up Note  Referring Provider: Gordan Payment., MD Primary Provider: Gordan Payment., MD Date of Office Visit: 05/20/2018  Subjective:   Lauren Benitez (DOB: 1950/11/06) is a 67 y.o. female [facility] in re-evaluation of the following:  HPI: Lauren Benitez returns to this clinic in reevaluation of her asthma and allergic rhinitis and LPR.  Her last visit to this clinic was 19 October 2017.  Her asthma has really been doing very well while using a very low dose inhaled steroid.  She has a minimal requirement for short acting bronchodilator and has not required a systemic steroid for treatment of an exacerbation since being seen in this clinic.  Likewise her upper airways are doing relatively well at this point.  It does not sound as though she has required an antibiotic to treat an episode of sinusitis.  Her reflux is a little bit active.  She has some regurgitation and burning up in her throat.  She had to discontinue her proton pump inhibitor because of renal dysfunction for which she is presently undergoing evaluation with a nephrologist.  She does not consume any caffeine or chocolate.  She does add in Tums to her ranitidine twice a day at this point.  She has undergone a course of immunotherapy for greater than 4 years at this point in time.  Allergies as of 05/20/2018      Reactions   Avelox [moxifloxacin Hcl In Nacl] Itching      Medication List      albuterol 108 (90 Base) MCG/ACT inhaler Commonly known as:  PROVENTIL HFA;VENTOLIN HFA Inhale 2 puffs into the lungs every 6 (six) hours as needed.   alendronate 70 MG tablet Commonly known as:  FOSAMAX   aspirin 81 MG EC tablet Take by mouth.   beclomethasone 40 MCG/ACT inhaler Commonly known as:  QVAR Inhale 2 puffs into the lungs daily.   CALCIUM 600/VITAMIN D 600-400 MG-UNIT Tabs Generic drug:  Calcium Carbonate-Vitamin D3 Take by mouth.   CALTRATE 600+D PO Take 1  tablet by mouth at bedtime.   levothyroxine 50 MCG tablet Commonly known as:  SYNTHROID, LEVOTHROID Take 50 mcg by mouth daily.   loratadine 10 MG tablet Commonly known as:  CLARITIN Take 10 mg by mouth daily.   metoprolol succinate 100 MG 24 hr tablet Commonly known as:  TOPROL-XL   multivitamin capsule Take 1 capsule by mouth daily.   QNASL 80 MCG/ACT Aers Generic drug:  Beclomethasone Dipropionate Place 1 spray into the nose daily.   ranitidine 300 MG capsule Commonly known as:  ZANTAC Take 1 capsule (300 mg total) by mouth at bedtime.   simvastatin 40 MG tablet Commonly known as:  ZOCOR Take 40 mg by mouth every evening.       Past Medical History:  Diagnosis Date  . Asthma   . Hyperlipemia   . Hypertension   . Kidney disease   . Seasonal allergies     Past Surgical History:  Procedure Laterality Date  . ADENOIDECTOMY    . APPENDECTOMY  1992  . TONSILLECTOMY    . VESICOVAGINAL FISTULA CLOSURE W/ TAH  1992    Review of systems negative except as noted in HPI / PMHx or noted below:  Review of Systems  Constitutional: Negative.   HENT: Negative.   Eyes: Negative.   Respiratory: Negative.   Cardiovascular: Negative.   Gastrointestinal: Negative.   Genitourinary: Negative.   Musculoskeletal: Negative.  Skin: Negative.   Neurological: Negative.   Endo/Heme/Allergies: Negative.   Psychiatric/Behavioral: Negative.      Objective:   Vitals:   05/20/18 1411  BP: 122/78  Pulse: 88  Resp: 20          Physical Exam  HENT:  Head: Normocephalic. Head is without right periorbital erythema and without left periorbital erythema.  Right Ear: Tympanic membrane, external ear and ear canal normal.  Left Ear: Tympanic membrane, external ear and ear canal normal.  Nose: Nose normal. No mucosal edema or rhinorrhea.  Mouth/Throat: Oropharynx is clear and moist and mucous membranes are normal. No oropharyngeal exudate.  Eyes: Pupils are equal, round, and  reactive to light. Conjunctivae and lids are normal.  Neck: Trachea normal. No tracheal deviation present. No thyromegaly present.  Cardiovascular: Normal rate, regular rhythm, S1 normal, S2 normal and normal heart sounds.  No murmur heard. Pulmonary/Chest: Effort normal. No stridor. No respiratory distress. She has no wheezes. She has no rales. She exhibits no tenderness.  Abdominal: Soft. She exhibits no distension and no mass. There is no hepatosplenomegaly. There is no tenderness. There is no rebound and no guarding.  Musculoskeletal: She exhibits no edema or tenderness.  Lymphadenopathy:       Head (right side): No tonsillar adenopathy present.       Head (left side): No tonsillar adenopathy present.    She has no cervical adenopathy.    She has no axillary adenopathy.  Neurological: She is alert.  Skin: No rash noted. She is not diaphoretic. No erythema. No pallor. Nails show no clubbing.    Diagnostics:    Spirometry was performed and demonstrated an FEV1 of 2.09 at 89 % of predicted.  The patient had an Asthma Control Test with the following results: ACT Total Score: 21.    Results of blood tests obtained 19 April 2018 identifies creatinine 1.74 mg/DL.  Assessment and Plan:   1. Asthma, well controlled, mild persistent   2. Other allergic rhinitis   3. LPRD (laryngopharyngeal reflux disease)     1. Continue Qvar 40 2 inhalations 1-2 times per day depending on disease activity. Increase to 3 inhalations three times per day during 'flare up'  2. Continue ranitidine 300 mg 2 times per day and use tums if needed  3. Continue nasal steroid (OTC Nasacort) 1-2 sprays each nostril one time per day   4. Continue antihistamine and ProAir HFA as needed  5. Discontinue immunotherapy  6. Return to clinic in 12 months or earlier if problem  7. Obtain fall flu vaccine  Lauren AspCindy appears to be doing very well regarding her atopic respiratory disease and immunotherapy appears to have  made it rather significant improvement and we can see if she continues to do well as we discontinued this form of treatment.  She will remain on a low dose of Qvar and nasal steroid.  Her reflux is slightly active and we had a long talk today about things that she can do to try to minimize this issue and she will continue on 600 mg of ranitidine per day and use Tums if needed at this point.  I will see her back in this clinic in 1 year or earlier if there is a problem.  Laurette SchimkeEric Rhylie Stehr, MD Allergy / Immunology [facility]

## 2018-05-24 ENCOUNTER — Encounter: Payer: Self-pay | Admitting: Allergy and Immunology

## 2018-09-29 ENCOUNTER — Ambulatory Visit (INDEPENDENT_AMBULATORY_CARE_PROVIDER_SITE_OTHER): Payer: Medicare Other | Admitting: Allergy and Immunology

## 2018-09-29 ENCOUNTER — Encounter: Payer: Self-pay | Admitting: Allergy and Immunology

## 2018-09-29 VITALS — BP 126/90 | HR 88 | Resp 16 | Ht 62.5 in | Wt 150.0 lb

## 2018-09-29 DIAGNOSIS — J4530 Mild persistent asthma, uncomplicated: Secondary | ICD-10-CM

## 2018-09-29 DIAGNOSIS — J3089 Other allergic rhinitis: Secondary | ICD-10-CM | POA: Diagnosis not present

## 2018-09-29 DIAGNOSIS — Z20828 Contact with and (suspected) exposure to other viral communicable diseases: Secondary | ICD-10-CM | POA: Diagnosis not present

## 2018-09-29 DIAGNOSIS — K219 Gastro-esophageal reflux disease without esophagitis: Secondary | ICD-10-CM

## 2018-09-29 MED ORDER — OSELTAMIVIR PHOSPHATE 75 MG PO CAPS: ORAL_CAPSULE | ORAL | 0 refills | Status: DC

## 2018-09-29 NOTE — Patient Instructions (Signed)
  1. Continue Qvar 40 2 inhalations 1-2 times per day depending on disease activity.   2. Increase Qvar to 3 inhalations three times per day and add Symbicort 160 2 inhalations 2 times a day during 'flare up'  3. Continue OTC Famotidine 40 mg daily  4. Continue OTC Nasacort 1-2 sprays each nostril one time per day   5. Continue antihistamine and ProAir HFA as needed  6. Tamiflu 75 mg 1 time per day for 10 days starting with exposure   7. Return to clinic in 12 months or earlier if problem

## 2018-09-29 NOTE — Progress Notes (Signed)
Follow-up Note  Referring Provider: Gordan PaymentGrisso, Greg A., MD Primary Provider: Mosie LukesMcKenzie, Timothy Date of Office Visit: 09/29/2018  Subjective:   Lauren BlaseCynthia A Benitez (DOB: 08/14/1951) is a 68 y.o. female who [facility] in re-evaluation of the following:  HPI: Lauren Benitez returns to this clinic in reevaluation of her asthma and allergic rhinitis and LPR.  Her last visit to this clinic was 20 May 2018.  She did very well with her upper and lower respiratory tract and did not require a systemic steroid or antibiotic while consistently using Qvar at 80 mcg daily and Nasacort daily and famotidine daily.  Rarely did she have any issues with coughing or wheezing and did not used to need to use a short acting bronchodilator and had no issues with her nose and her reflux was under good control as was the issue with her throat.  Unfortunately, right after Christmas, she developed coughing and wheezing and head congestion and ugly nasal discharge and feeling really bad in general without any fever for which she went to the urgent care on 2 occasions and saw her primary care physician on one occasion and was treated with a Z-Pak, 2 injections of dexamethasone, and 2 courses of prednisone.  Fortunately, she is much better at this point time and almost all of her issue has resolved other than some slight cough and some occasional postnasal drip.  She did activate her "action plan" including introduction of Symbicort when she became sick.  As well, she cares for her granddaughter on a daily basis when living in [place] and she was just informed today that her granddaughter has type B flu.  She did obtain the flu vaccine this year.  Allergies as of 09/29/2018      Reactions   Avelox [moxifloxacin Hcl In Nacl] Itching      Medication List      albuterol 108 (90 Base) MCG/ACT inhaler Commonly known as:  PROAIR HFA Inhale 2 puffs into the lungs every 6 (six) hours as  needed.   alendronate 70 MG tablet Commonly known as:  FOSAMAX   aspirin 81 MG EC tablet Take by mouth.   beclomethasone 40 MCG/ACT inhaler Commonly known as:  QVAR Inhale 2 puffs into the lungs daily.   CALCIUM 600/VITAMIN D 600-400 MG-UNIT Tabs Generic drug:  Calcium Carbonate-Vitamin D3 Take by mouth.   CALTRATE 600+D PO Take 1 tablet by mouth at bedtime.   famotidine 20 MG tablet Commonly known as:  PEPCID 1 tab twice daily for stomach   levothyroxine 50 MCG tablet Commonly known as:  SYNTHROID, LEVOTHROID Take 50 mcg by mouth daily.   loratadine 10 MG tablet Commonly known as:  CLARITIN Take 10 mg by mouth daily.   metoprolol succinate 100 MG 24 hr tablet Commonly known as:  TOPROL-XL   multivitamin capsule Take 1 capsule by mouth daily.   simvastatin 40 MG tablet Commonly known as:  ZOCOR Take 40 mg by mouth every evening.   SYMBICORT 160-4.5 MCG/ACT inhaler Generic drug:  budesonide-formoterol 2 puffs twice daily       Past Medical History:  Diagnosis Date  . Asthma   . Hyperlipemia   . Hypertension   . Kidney disease   . Seasonal allergies     Past Surgical History:  Procedure Laterality Date  . ADENOIDECTOMY    . APPENDECTOMY  1992  . TONSILLECTOMY    . VESICOVAGINAL FISTULA CLOSURE W/ TAH  1992  Review of systems negative except as noted in HPI / PMHx or noted below:  Review of Systems  Constitutional: Negative.   HENT: Negative.   Eyes: Negative.   Respiratory: Negative.   Cardiovascular: Negative.   Gastrointestinal: Negative.   Genitourinary: Negative.   Musculoskeletal: Negative.   Skin: Negative.   Neurological: Negative.   Endo/Heme/Allergies: Negative.   Psychiatric/Behavioral: Negative.      Objective:   Vitals:   09/29/18 1150  BP: 126/90  Pulse: 88  Resp: 16   Height: 5' 2.5" (158.8 cm)  Weight: 150 lb (68 kg)   Physical Exam Constitutional:      Appearance: She is not diaphoretic.  HENT:     Head:  Normocephalic.     Right Ear: Tympanic membrane, ear canal and external ear normal.     Left Ear: Tympanic membrane, ear canal and external ear normal.     Nose: Nose normal. No mucosal edema or rhinorrhea.     Mouth/Throat:     Pharynx: Uvula midline. No oropharyngeal exudate.  Eyes:     Conjunctiva/sclera: Conjunctivae normal.  Neck:     Thyroid: No thyromegaly.     Trachea: Trachea normal. No tracheal tenderness or tracheal deviation.  Cardiovascular:     Rate and Rhythm: Normal rate and regular rhythm.     Heart sounds: Normal heart sounds, S1 normal and S2 normal. No murmur.  Pulmonary:     Effort: No respiratory distress.     Breath sounds: Normal breath sounds. No stridor. No wheezing or rales.  Lymphadenopathy:     Head:     Right side of head: No tonsillar adenopathy.     Left side of head: No tonsillar adenopathy.     Cervical: No cervical adenopathy.  Skin:    Findings: No erythema or rash.     Nails: There is no clubbing.   Neurological:     Mental Status: She is alert.     Diagnostics:    Spirometry was performed and demonstrated an FEV1 of 2.53 at 1150.76 % of predicted.  The patient had an Asthma Control Test with the following results: ACT Total Score: 19.    Assessment and Plan:   1. Asthma, well controlled, mild persistent   2. Other allergic rhinitis   3. LPRD (laryngopharyngeal reflux disease)   4. Exposure to influenza     1. Continue Qvar 40 2 inhalations 1-2 times per day depending on disease activity.   2. Increase Qvar to 3 inhalations three times per day and add Symbicort 160 2 inhalations 2 times a day during 'flare up'  3. Continue OTC Famotidine 40 mg daily  4. Continue OTC Nasacort 1-2 sprays each nostril one time per day   5. Continue antihistamine and ProAir HFA as needed  6. Tamiflu 75 mg 1 time per day for 10 days starting with exposure   7. Return to clinic in 12 months or earlier if problem  Overall Lauren Benitez appears to have  done relatively well.  Obviously she contracted some type of respiratory tract infection during Christmas time which fortunately appears to have resolved with aggressive therapy with antibiotics and systemic steroids.  I would not doubt that this was actually influenza.  She will continue on anti-inflammatory agents as noted above and therapy for reflux and I will see her back in this clinic in 12 months.  She will be having exposure to an individual with influenza and I have given her a preventative Tamiflu prescription.  Allena Katz, MD Allergy / Immunology [place]

## 2018-09-30 ENCOUNTER — Encounter: Payer: Self-pay | Admitting: Allergy and Immunology

## 2019-12-01 ENCOUNTER — Other Ambulatory Visit: Payer: Self-pay

## 2019-12-01 ENCOUNTER — Encounter: Payer: Self-pay | Admitting: Allergy and Immunology

## 2019-12-01 ENCOUNTER — Ambulatory Visit (INDEPENDENT_AMBULATORY_CARE_PROVIDER_SITE_OTHER): Payer: Medicare Other | Admitting: Allergy and Immunology

## 2019-12-01 VITALS — BP 128/86 | HR 84 | Temp 98.3°F | Resp 16 | Ht 62.2 in | Wt 150.0 lb

## 2019-12-01 DIAGNOSIS — J3089 Other allergic rhinitis: Secondary | ICD-10-CM | POA: Diagnosis not present

## 2019-12-01 DIAGNOSIS — J301 Allergic rhinitis due to pollen: Secondary | ICD-10-CM

## 2019-12-01 DIAGNOSIS — J4521 Mild intermittent asthma with (acute) exacerbation: Secondary | ICD-10-CM

## 2019-12-01 DIAGNOSIS — K219 Gastro-esophageal reflux disease without esophagitis: Secondary | ICD-10-CM | POA: Diagnosis not present

## 2019-12-01 MED ORDER — METHYLPREDNISOLONE ACETATE 80 MG/ML IJ SUSP
80.0000 mg | Freq: Once | INTRAMUSCULAR | Status: AC
  Administered 2019-12-01: 80 mg via INTRAMUSCULAR

## 2019-12-01 NOTE — Progress Notes (Signed)
[facility] [place] [facility] [place]   Follow-up Note  Referring Provider: Racheal Patches, MD Primary Provider: Racheal Patches, MD Date of Office Visit: 12/01/2019  Subjective:   Lauren Benitez (DOB: July 16, 1951) is a 69 y.o. female who returns to the [facility] on 12/01/2019 in re-evaluation of the following:  HPI: Jenny Reichmann returns to this clinic in evaluation of asthma and allergic rhinitis and LPR. Her last visit to this clinic was 29 September 2018.  She has really done well with her asthma throughout the entire year and has had very little issues with her nose. She has not required a systemic steroid or an antibiotic for any type of airway issue. Rarely does she use a short acting bronchodilator while she continues on Qvar and a nasal steroid.  However, over the course of the past week or so, she has developed sneezing and coughing and congestion of her upper airway and throat and has had to use her short acting bronchodilator. She attempts to avoid the outdoors as much as possible for that is a major trigger giving rise to the symptoms.  Her reflux is under very good control as is her throat issue.  She did receive 2 Moderna Covid injections over a month ago.  Allergies as of 12/01/2019      Reactions   Avelox [moxifloxacin Hcl In Nacl] Itching      Medication List    albuterol 108 (90 Base) MCG/ACT inhaler Commonly known as: ProAir HFA Inhale 2 puffs into the lungs every 6 (six) hours as needed.   alendronate 70 MG tablet Commonly known as: FOSAMAX   aspirin 81 MG EC tablet Take by mouth.   beclomethasone 40 MCG/ACT inhaler Commonly known as: QVAR Inhale 2 puffs into the lungs daily.   Calcium 600/Vitamin D 600-400 MG-UNIT Tabs Generic drug: Calcium Carbonate-Vitamin D3 Take by mouth.   famotidine 20 MG tablet Commonly known as: PEPCID 1 tab twice daily for stomach   levothyroxine 50 MCG tablet Commonly known  as: SYNTHROID Take 50 mcg by mouth daily.   loratadine 10 MG tablet Commonly known as: CLARITIN Take 10 mg by mouth daily.   metoprolol succinate 100 MG 24 hr tablet Commonly known as: TOPROL-XL Take 100 mg by mouth in the morning and at bedtime.   multivitamin capsule Take 1 capsule by mouth daily.   simvastatin 40 MG tablet Commonly known as: ZOCOR Take 40 mg by mouth every evening.       Past Medical History:  Diagnosis Date  . Asthma   . Hyperlipemia   . Hypertension   . Kidney disease   . Seasonal allergies     Past Surgical History:  Procedure Laterality Date  . ADENOIDECTOMY    . APPENDECTOMY  1992  . TONSILLECTOMY    . VESICOVAGINAL FISTULA CLOSURE W/ TAH  1992    Review of systems negative except as noted in HPI / PMHx or noted below:  Review of Systems  Constitutional: Negative.   HENT: Negative.   Eyes: Negative.   Respiratory: Negative.   Cardiovascular: Negative.   Gastrointestinal: Negative.   Genitourinary: Negative.   Musculoskeletal: Negative.   Skin: Negative.   Neurological: Negative.   Endo/Heme/Allergies: Negative.   Psychiatric/Behavioral: Negative.      Objective:   Vitals:   12/01/19 1704  BP: 128/86  Pulse: 84  Resp: 16  Temp: 98.3 F (36.8 C)  SpO2: 97%   Height: 5' 2.2" (  158 cm)  Weight: 150 lb (68 kg)   Physical Exam Constitutional:      Appearance: She is not diaphoretic.  HENT:     Head: Normocephalic.     Right Ear: Tympanic membrane, ear canal and external ear normal.     Left Ear: Tympanic membrane, ear canal and external ear normal.     Nose: Nose normal. No mucosal edema or rhinorrhea.     Mouth/Throat:     Pharynx: Uvula midline. No oropharyngeal exudate.  Eyes:     Conjunctiva/sclera: Conjunctivae normal.  Neck:     Thyroid: No thyromegaly.     Trachea: Trachea normal. No tracheal tenderness or tracheal deviation.  Cardiovascular:     Rate and Rhythm: Normal rate and regular rhythm.     Heart  sounds: Normal heart sounds, S1 normal and S2 normal. No murmur.  Pulmonary:     Effort: No respiratory distress.     Breath sounds: Normal breath sounds. No stridor. No wheezing or rales.  Lymphadenopathy:     Head:     Right side of head: No tonsillar adenopathy.     Left side of head: No tonsillar adenopathy.     Cervical: No cervical adenopathy.  Skin:    Findings: No erythema or rash.     Nails: There is no clubbing.  Neurological:     Mental Status: She is alert.     Diagnostics:    Spirometry was performed and demonstrated an FEV1 of 1.72 at 83 % of predicted.  Assessment and Plan:   1. Asthma, not well controlled, mild intermittent, with acute exacerbation   2. Perennial allergic rhinitis   3. Seasonal allergic rhinitis due to pollen   4. LPRD (laryngopharyngeal reflux disease)     1. Continue Qvar 40 2 inhalations 1-2 times per day depending on disease activity.   2. Increase Qvar to 3 inhalations three times per day during 'flare up'  3. Continue OTC Famotidine 40 mg daily  4. Continue OTC Nasacort 1-2 sprays each nostril one time per day   5. Continue antihistamine and ProAir HFA as needed  6.  Depo-Medrol 80 IM delivered in clinic today  7. Return to clinic in 12 months or earlier if problem  [name] appears to have a slight flare of her atopic respiratory disease for which we will give her a systemic steroid while she continues to use a collection of anti-inflammatory agents for both her upper and lower airway. As well, her reflux appears to be under very good control at this point in time while utilizing an H2 receptor blocker and she will continue on that medication. She will keep in contact with [place] noting her response to this approach as we go through this spring season and throughout the rest of the year. Further evaluation and treatment will be based upon her response.  [name], MD Allergy / Immunology [facility]

## 2019-12-01 NOTE — Patient Instructions (Signed)
  1. Continue Qvar 40 2 inhalations 1-2 times per day depending on disease activity.   2. Increase Qvar to 3 inhalations three times per day during 'flare up'  3. Continue OTC Famotidine 40 mg daily  4. Continue OTC Nasacort 1-2 sprays each nostril one time per day   5. Continue antihistamine and ProAir HFA as needed  6.  Depo-Medrol 80 IM delivered in clinic today  7. Return to clinic in 12 months or earlier if problem

## 2019-12-05 ENCOUNTER — Encounter: Payer: Self-pay | Admitting: Allergy and Immunology

## 2020-01-18 ENCOUNTER — Telehealth: Payer: Self-pay | Admitting: *Deleted

## 2020-01-18 MED ORDER — ALBUTEROL SULFATE HFA 108 (90 BASE) MCG/ACT IN AERS: INHALATION_SPRAY | RESPIRATORY_TRACT | 1 refills | Status: AC

## 2020-01-18 MED ORDER — FLOVENT HFA 110 MCG/ACT IN AERO: INHALATION_SPRAY | RESPIRATORY_TRACT | 5 refills | Status: DC

## 2020-01-18 NOTE — Telephone Encounter (Signed)
Flovent 110-2 inhalations 1-2 times per day depending on disease activity

## 2020-01-18 NOTE — Telephone Encounter (Signed)
RX sent

## 2020-01-18 NOTE — Telephone Encounter (Signed)
Due to her Qvar costing $300, can we send Flovent instead? If so, please indicated the strength and frequency.   [facility], [employer] [address] [place].

## 2020-02-27 ENCOUNTER — Other Ambulatory Visit: Payer: Self-pay | Admitting: *Deleted

## 2020-02-27 MED ORDER — FLOVENT HFA 110 MCG/ACT IN AERO: INHALATION_SPRAY | RESPIRATORY_TRACT | 1 refills | Status: AC

## 2021-01-24 ENCOUNTER — Encounter: Payer: Self-pay | Admitting: Allergy and Immunology

## 2021-01-24 ENCOUNTER — Ambulatory Visit (INDEPENDENT_AMBULATORY_CARE_PROVIDER_SITE_OTHER): Payer: Medicare Other | Admitting: Allergy and Immunology

## 2021-01-24 ENCOUNTER — Other Ambulatory Visit: Payer: Self-pay

## 2021-01-24 VITALS — BP 118/72 | HR 85 | Resp 16 | Ht 62.0 in | Wt 153.4 lb

## 2021-01-24 DIAGNOSIS — J3089 Other allergic rhinitis: Secondary | ICD-10-CM | POA: Diagnosis not present

## 2021-01-24 DIAGNOSIS — J4541 Moderate persistent asthma with (acute) exacerbation: Secondary | ICD-10-CM | POA: Diagnosis not present

## 2021-01-24 DIAGNOSIS — K219 Gastro-esophageal reflux disease without esophagitis: Secondary | ICD-10-CM

## 2021-01-24 MED ORDER — HYDROCOD POLST-CPM POLST ER 10-8 MG/5ML PO SUER: ORAL | 0 refills | Status: AC

## 2021-01-24 MED ORDER — AZITHROMYCIN 500 MG PO TABS: ORAL_TABLET | ORAL | 0 refills | Status: AC

## 2021-01-24 NOTE — Patient Instructions (Addendum)
  1.  Start azithromycin 500 mg - 1 tablet 1 time per day for 3 days only  2.  Start prednisone 10 mg - 1 tablet 1 time per day for 10 days only  3.  INCREASE OTC Famotidine 40 mg 2 times per day for 10 days only  4.  Continue Breztri - 2 inhalations 1-2 times per day depending on disease activity.  5. Continue OTC Nasacort 1-2 sprays each nostril one time per day   6. If Needed:   A.   Allegra 180 - 1 tablet 1-2 times per day  B.   Mucinex - 1-2 tablets 1-2 times per day  B.   Tussionex - 2.5 mls every 12 hours. NARCOTIC. 60 mls  C.   ProAir HFA as needed  7.  Return to clinic in 12 months or earlier if problem

## 2021-01-24 NOTE — Progress Notes (Signed)
[place] [facility] [place] [facility] [place] [facility] [place] [facility] [place]   Follow-up Note  Referring Provider: Caryl Bis, MD Primary Provider: Caryl Bis, MD Date of Office Visit: 01/24/2021  Subjective:   Lauren Benitez (DOB: 08-26-1951) is a 70 y.o. female who [facility] in re-evaluation of the following:  HPI: [name] returns to this clinic in reevaluation of asthma and allergic rhinoconjunctivitis and LPR.  Her last visit to this clinic was 01 December 2019.  She really did relatively well with her airway issue not requiring a systemic steroid or antibiotic while she using relatively low dose inhaled steroids and a nasal steroid to control her atopic respiratory disease.  Unfortunately, approximately 5 or 6 weeks ago, after being exposed to her daughter who had a cough, she developed an unrelenting cough.  This cough feels as though there is something stuck in her upper chest and her throat.  It is disturbing her sleep.  It does not appear to respond to a short acting bronchodilator.  She went to see her primary care doctor and initially received a course of prednisone.  She subsequently had a change in her medical therapy where she is now using a triple inhaler and montelukast for the past 2 weeks and has not really noticed much improvement.  She thinks that her reflux is under very good control.  Allergies as of 01/24/2021      Reactions   Avelox [moxifloxacin Hcl In Nacl] Itching      Medication List    albuterol 108 (90 Base) MCG/ACT inhaler Commonly known as: ProAir HFA Inhale two puffs every 4-6 hours if needed for cough or wheeze.   alendronate 70 MG tablet Commonly known as: FOSAMAX   Allegra Allergy 180 MG tablet Generic drug: fexofenadine Take 180 mg by mouth daily.   aspirin 81 MG EC tablet Take by mouth.   Breztri Aerosphere 160-9-4.8 MCG/ACT Aero Generic drug: Budeson-Glycopyrrol-Formoterol Inhale into the  lungs.   Calcium Carbonate-Vitamin D3 600-400 MG-UNIT Tabs Take by mouth.   famotidine 20 MG tablet Commonly known as: PEPCID 1 tab twice daily for stomach   Flovent HFA 110 MCG/ACT inhaler Generic drug: fluticasone Inhale two puffs 1-2 times daily depending on disease activity. Rinse mouth after use.   levothyroxine 50 MCG tablet Commonly known as: SYNTHROID Take 50 mcg by mouth daily.   metoprolol succinate 100 MG 24 hr tablet Commonly known as: TOPROL-XL Take 100 mg by mouth in the morning and at bedtime.   multivitamin capsule Take 1 capsule by mouth daily.   simvastatin 40 MG tablet Commonly known as: ZOCOR Take 40 mg by mouth every evening.   Singulair 10 MG tablet Generic drug: montelukast SMARTSIG:1 Tablet(s) By Mouth Every Evening       Past Medical History:  Diagnosis Date  . Asthma   . Hyperlipemia   . Hypertension   . Kidney disease   . Seasonal allergies     Past Surgical History:  Procedure Laterality Date  . ADENOIDECTOMY    . APPENDECTOMY  1992  . TONSILLECTOMY    . VESICOVAGINAL FISTULA CLOSURE W/ TAH  1992    Review of systems negative except as noted in HPI / PMHx or noted below:  Review of Systems  Constitutional: Negative.   HENT: Negative.   Eyes: Negative.   Respiratory: Negative.   Cardiovascular: Negative.   Gastrointestinal: Negative.   Genitourinary: Negative.   Musculoskeletal: Negative.   Skin:  Negative.   Neurological: Negative.   Endo/Heme/Allergies: Negative.   Psychiatric/Behavioral: Negative.      Objective:   Vitals:   01/24/21 1530  BP: 118/72  Pulse: 85  Resp: 16  SpO2: 93%   Height: 5\' 2"  (157.5 cm)  Weight: 153 lb 6.4 oz (69.6 kg)   Physical Exam Constitutional:      Appearance: She is not diaphoretic.  HENT:     Head: Normocephalic.     Right Ear: Tympanic membrane, ear canal and external ear normal.     Left Ear: Tympanic membrane, ear canal and external ear normal.     Nose: Nose normal.  No mucosal edema or rhinorrhea.     Mouth/Throat:     Pharynx: Uvula midline. No oropharyngeal exudate.  Eyes:     Conjunctiva/sclera: Conjunctivae normal.  Neck:     Thyroid: No thyromegaly.     Trachea: Trachea normal. No tracheal tenderness or tracheal deviation.  Cardiovascular:     Rate and Rhythm: Normal rate and regular rhythm.     Heart sounds: Normal heart sounds, S1 normal and S2 normal. No murmur heard.   Pulmonary:     Effort: No respiratory distress.     Breath sounds: Normal breath sounds. No stridor. No wheezing or rales.  Lymphadenopathy:     Head:     Right side of head: No tonsillar adenopathy.     Left side of head: No tonsillar adenopathy.     Cervical: No cervical adenopathy.  Skin:    Findings: No erythema or rash.     Nails: There is no clubbing.  Neurological:     Mental Status: She is alert.     Diagnostics:    Spirometry was performed and demonstrated an FEV1 of 1.82 at 90 % of predicted.  Assessment and Plan:   1. Asthma, not well controlled, moderate persistent, with acute exacerbation   2. Perennial allergic rhinitis   3. Gastroesophageal reflux disease, unspecified whether esophagitis present     1.  Start azithromycin 500 mg - 1 tablet 1 time per day for 3 days only  2.  Start prednisone 10 mg - 1 tablet 1 time per day for 10 days only  3.  INCREASE OTC Famotidine 40 mg 2 times per day for 10 days only  4.  Continue Breztri - 2 inhalations 1-2 times per day depending on disease activity.  5. Continue OTC Nasacort 1-2 sprays each nostril one time per day   6. If Needed:   A.   Allegra 180 - 1 tablet 1-2 times per day  B.   Mucinex - 1-2 tablets 1-2 times per day  B.   Tussionex - 2.5 mls every 12 hours. NARCOTIC. 60 mls  C.   ProAir HFA as needed  7.  Return to clinic in 12 months or earlier if problem  It appears as though developed some type of infection several weeks back that is giving rise to significant inflammation  and irritation of her airway.  We will give her azithromycin to cover possible mycoplasma infection.  Because of her history of preexistent reflux we will have her increase her therapy for reflux to prevent reflux induced respiratory disease.  I have given her a narcotic-based cough suppressant to be used should she need to use this medication especially for sleep at night.  She will keep in contact with me noting her response to this approach and further evaluation and treatment will be based upon her  response.  [name], MD Allergy / Immunology [facility]

## 2021-01-28 ENCOUNTER — Encounter: Payer: Self-pay | Admitting: Allergy and Immunology

## 2021-02-25 ENCOUNTER — Telehealth: Payer: Self-pay | Admitting: Allergy and Immunology

## 2021-02-25 NOTE — Telephone Encounter (Signed)
Talked with patient.  She would like her last office visit note and her allergy testing results sent to her home address. Please help.

## 2021-02-25 NOTE — Telephone Encounter (Signed)
Patient called requesting to speak with Dahlia Client or Carollee Herter. Both were with patient's. I offered [place] and she said no. Did not leave a message on what she needed.
